# Patient Record
Sex: Male | Born: 1970 | Race: White | Hispanic: No | Marital: Married | Smoking: Never smoker
Health system: Southern US, Community
[De-identification: ages and names within clinical notes are randomized; demographics above are authoritative.]

---

## 2001-12-07 ENCOUNTER — Encounter: Payer: Self-pay | Admitting: Family Medicine

## 2006-12-16 ENCOUNTER — Emergency Department (HOSPITAL_COMMUNITY): Admission: EM | Admit: 2006-12-16 | Discharge: 2006-12-16 | Payer: Self-pay | Admitting: Emergency Medicine

## 2008-08-13 ENCOUNTER — Emergency Department (HOSPITAL_COMMUNITY): Admission: EM | Admit: 2008-08-13 | Discharge: 2008-08-13 | Payer: Self-pay | Admitting: Emergency Medicine

## 2009-04-04 ENCOUNTER — Ambulatory Visit: Payer: Self-pay | Admitting: Family Medicine

## 2009-04-04 DIAGNOSIS — D1800 Hemangioma unspecified site: Secondary | ICD-10-CM | POA: Insufficient documentation

## 2009-04-04 DIAGNOSIS — R109 Unspecified abdominal pain: Secondary | ICD-10-CM | POA: Insufficient documentation

## 2009-04-05 ENCOUNTER — Ambulatory Visit: Payer: Self-pay | Admitting: Family Medicine

## 2009-04-05 LAB — CONVERTED CEMR LAB
ALT: 29 units/L (ref 0–53)
AST: 23 units/L (ref 0–37)
Albumin: 4.4 g/dL (ref 3.5–5.2)
Alkaline Phosphatase: 70 units/L (ref 39–117)
BUN: 12 mg/dL (ref 6–23)
Basophils Absolute: 0 10*3/uL (ref 0.0–0.1)
Blood in Urine, dipstick: NEGATIVE
CO2: 30 meq/L (ref 19–32)
Chloride: 102 meq/L (ref 96–112)
Direct LDL: 158.6 mg/dL
Eosinophils Relative: 1.7 % (ref 0.0–5.0)
Glucose, Bld: 96 mg/dL (ref 70–99)
Glucose, Urine, Semiquant: NEGATIVE
HDL: 48.4 mg/dL (ref >39.00)
Monocytes Absolute: 0.3 10*3/uL (ref 0.1–1.0)
Monocytes Relative: 5.1 % (ref 3.0–12.0)
Neutrophils Relative %: 55.5 % (ref 43.0–77.0)
Platelets: 140 10*3/uL — ABNORMAL LOW (ref 150.0–400.0)
Potassium: 4.4 meq/L (ref 3.5–5.1)
RDW: 12.1 % (ref 11.5–14.6)
Specific Gravity, Urine: 1.02
TSH: 2.14 microintl units/mL (ref 0.35–5.50)
Total Protein: 7.4 g/dL (ref 6.0–8.3)
WBC Urine, dipstick: NEGATIVE
WBC: 6.5 10*3/uL (ref 4.5–10.5)
pH: 8

## 2009-04-17 ENCOUNTER — Ambulatory Visit: Payer: Self-pay | Admitting: Family Medicine

## 2009-06-27 ENCOUNTER — Ambulatory Visit: Payer: Self-pay | Admitting: Family Medicine

## 2009-06-27 DIAGNOSIS — M79609 Pain in unspecified limb: Secondary | ICD-10-CM

## 2010-03-20 NOTE — Assessment & Plan Note (Signed)
Summary: BRAND NEW PT/TO EST/CJR   Vital Signs:  Patient profile:   40 year old male Height:      70.25 inches Weight:      196 pounds BMI:     28.02 Temp:     98.4 degrees F oral Pulse rate:   80 / minute Pulse rhythm:   regular Resp:     12 per minute BP sitting:   120 / 88  (left arm) Cuff size:   regular  Vitals Entered By: Sid Falcon LPN (April 04, 2009 11:02 AM)  Nutrition Counseling: Patient's BMI is greater than 25 and therefore counseled on weight management options. CC: New to establish, GI concerns   History of Present Illness: new patient to establish care. No chronic medical problems. Takes no medications. No known drug allergies. No prior surgeries. Has not had a complete physical couple years on like to schedule.  Family history significant for mother having breast cancer father died of esophageal cancer  Heart disease in grandparent.  Patient is married has 2 yo daughter. Never smoked. Occasional alcohol use. 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  Patient has GI concerns. Most of his life his had some infrequent bowel movements which have improved with fiber intake and exercise. He has what he describes as probable gaseous discomfort very sporadically and intermittently with diffuse pains but no clear exacerbating features or alleviating features. No recent change in stool habits.  Mild to moderate intensity of pain.  Not related to any specific foods.  Appetite good and weight relatively stable.  Scatterd asymtomatic reddish skin lesions on trunk noted past year.  Preventive Screening-Counseling & Management  Alcohol-Tobacco     Smoking Status: never  Caffeine-Diet-Exercise     Does Patient Exercise: yes  Allergies (verified): No Known Drug Allergies  Past History:  Family History: Last updated: 04/04/2009 Breast cancer, mother  Social History: Last updated: 04/04/2009 Occupation: Married Never  Smoked Alcohol use-no Regular exercise-yes  Risk Factors: Exercise: yes (04/04/2009)  Risk Factors: Smoking Status: never (04/04/2009) PMH-FH-SH reviewed for relevance  Family History: Breast cancer, mother  Social History: Occupation: Married Never Smoked Alcohol use-no Regular exercise-yes Occupation:  employed Smoking Status:  never Does Patient Exercise:  yes  Review of Systems  The patient denies anorexia, fever, weight loss, chest pain, syncope, melena, hematochezia, incontinence, severe indigestion/heartburn, muscle weakness, and enlarged lymph nodes.         has a couple small erythematous asymptomatic skin lesions on trunk which she wishes to have evaluated  Physical Exam  General:  Well-developed,well-nourished,in no acute distress; alert,appropriate and cooperative throughout examination Head:  Normocephalic and atraumatic without obvious abnormalities. No apparent alopecia or balding. Ears:  External ear exam shows no significant lesions or deformities.  Otoscopic examination reveals clear canals, tympanic membranes are intact bilaterally without bulging, retraction, inflammation or discharge. Hearing is grossly normal bilaterally. Mouth:  Oral mucosa and oropharynx without lesions or exudates.  Teeth in good repair. Neck:  No deformities, masses, or tenderness noted. Lungs:  Normal respiratory effort, chest expands symmetrically. Lungs are clear to auscultation, no crackles or wheezes. Heart:  Normal rate and regular rhythm. S1 and S2 normal without gallop, murmur, click, rub or other extra sounds. Skin:  has a few scattered small angiomas trunk which are benign no concerning lesions noted Cervical Nodes:  No lymphadenopathy noted Psych:  normally interactive, good eye contact, not anxious appearing, and not depressed appearing.     Impression &  Recommendations:  Problem # 1:  ABDOMINAL PAIN, UNSPECIFIED SITE (ICD-789.00) suspect gaseous.  Pt will schedule  CPE and discuss further then.  Discussed appropriate daily intake of fiber and exercise.  Problem # 2:  ANGIOMA (ICD-228.00) reassured these are benign  Complete Medication List: 1)  Omega 3 340 Mg Cpdr (Omega-3 fatty acids) .... Once daily  Patient Instructions: 1)  Schedule complete physical examination at your convenience 2)  It is important that you exercise reguarly at least 20 minutes 5 times a week. If you develop chest pain, have severe difficulty breathing, or feel very tired, stop exercising immediately and seek medical attention.  3)  You need to lose weight. Consider a lower calorie diet and regular exercise.

## 2010-03-20 NOTE — Assessment & Plan Note (Signed)
Summary: finger trauma/dm   Vital Signs:  Patient profile:   40 year old male Temp:     98.7 degrees F oral BP sitting:   110 / 78  (left arm) Cuff size:   large  Vitals Entered By: Sid Falcon LPN (Jun 27, 2009 12:32 PM) CC: left hand discomfort   History of Present Illness: One week history left hand pain. Pain radiates from the MCP joint ring finger left hand up toward the wrist. Occasional sharp shooting pains along the ulnar aspect of the forearm. No specific injury. Has been doing a substantial amount of yard work over the past couple of weeks. No visible swelling. No weakness. Left-hand-dominant. Denies any numbness.  No neck pain.  Has not taken anything for relief. Pain moderate severity.  Allergies (verified): No Known Drug Allergies  Review of Systems      See HPI  Physical Exam  General:  Well-developed,well-nourished,in no acute distress; alert,appropriate and cooperative throughout examination Extremities:  left hand reveals no edema, erythema, or ecchymosis. No localizing bony tenderness in the wrist or hand. Full range of motion wrists. Neurologic:  normal sensory function throughout the hand. Deep tendon reflexes are trace to 1+ upper extremities bilaterally. No strength deficits noted.   Impression & Recommendations:  Problem # 1:  HAND PAIN (ICD-729.5) ?synovitis 4th MCP joint from overuse.  Sharp shooting pains ?ulnar nerve.  Nonfocal neuro exam at this time.  He will try OTC NSAID for hand and touch base 2 weeks if no better.  Complete Medication List: 1)  Omega 3 340 Mg Cpdr (Omega-3 fatty acids) .... Once daily  Patient Instructions: 1)  Take over the counter antiinflammatory medication and followup in 2 weeks if no better.

## 2010-03-20 NOTE — Assessment & Plan Note (Signed)
Summary: cpx/njr   Vital Signs:  Patient profile:   40 year old male Height:      70.25 inches Weight:      200 pounds Temp:     97.8 degrees F oral Pulse rate:   80 / minute Pulse rhythm:   regular Resp:     12 per minute BP sitting:   110 / 70  (left arm) Cuff size:   regular  Vitals Entered By: Sid Falcon LPN (April 17, 2009 2:52 PM) CC: Adult CPX   History of Present Illness: pt here for CPE.  Tetanus 2009.  Recently started back more regular exercise.  Training for a triathlon. PMH, SH, and FH reviewed and as recorded.  Allergies (verified): No Known Drug Allergies  Past History:  Family History: Last updated: 04/04/2009 Breast cancer, mother  Social History: Last updated: 04/04/2009 Occupation: Married Never Smoked Alcohol use-no Regular exercise-yes  Risk Factors: Exercise: yes (04/04/2009)  Risk Factors: Smoking Status: never (04/04/2009)  Past Medical History: no chronic problems.  Past Surgical History: none PMH-FH-SH reviewed for relevance  Review of Systems  The patient denies anorexia, fever, weight loss, weight gain, vision loss, decreased hearing, hoarseness, chest pain, syncope, dyspnea on exertion, peripheral edema, prolonged cough, headaches, hemoptysis, abdominal pain, melena, hematochezia, severe indigestion/heartburn, hematuria, incontinence, genital sores, muscle weakness, suspicious skin lesions, transient blindness, difficulty walking, depression, unusual weight change, abnormal bleeding, and testicular masses.    Physical Exam  General:  Well-developed,well-nourished,in no acute distress; alert,appropriate and cooperative throughout examination Head:  Normocephalic and atraumatic without obvious abnormalities. No apparent alopecia or balding. Eyes:  No corneal or conjunctival inflammation noted. EOMI. Perrla. Funduscopic exam benign, without hemorrhages, exudates or papilledema. Vision grossly normal. Ears:  External ear  exam shows no significant lesions or deformities.  Otoscopic examination reveals clear canals, tympanic membranes are intact bilaterally without bulging, retraction, inflammation or discharge. Hearing is grossly normal bilaterally. Nose:  External nasal examination shows no deformity or inflammation. Nasal mucosa are pink and moist without lesions or exudates. Mouth:  Oral mucosa and oropharynx without lesions or exudates.  Teeth in good repair. Neck:  No deformities, masses, or tenderness noted. Lungs:  Normal respiratory effort, chest expands symmetrically. Lungs are clear to auscultation, no crackles or wheezes. Heart:  Normal rate and regular rhythm. S1 and S2 normal without gallop, murmur, click, rub or other extra sounds. Abdomen:  Bowel sounds positive,abdomen soft and non-tender without masses, organomegaly or hernias noted. Genitalia:  Testes bilaterally descended without nodularity, tenderness or masses. No scrotal masses or lesions. No penis lesions or urethral discharge. Msk:  No deformity or scoliosis noted of thoracic or lumbar spine.   Extremities:  No clubbing, cyanosis, edema, or deformity noted with normal full range of motion of all joints.   Neurologic:  No cranial nerve deficits noted. Station and gait are normal. Plantar reflexes are down-going bilaterally. DTRs are symmetrical throughout. Sensory, motor and coordinative functions appear intact. Skin:  Intact without suspicious lesions or rashes Cervical Nodes:  No lymphadenopathy noted Psych:  Cognition and judgment appear intact. Alert and cooperative with normal attention span and concentration. No apparent delusions, illusions, hallucinations   Impression & Recommendations:  Problem # 1:  Preventive Health Care (ICD-V70.0) Work on weight loss.  Pt will schedule f/u lipids in 4-6 months.  Minimally low plts of 140K (doubt significant) and will repeat CBC at that time.  Complete Medication List: 1)  Omega 3 340 Mg Cpdr  (Omega-3 fatty acids) .... Once  daily  Patient Instructions: 1)  Schedule the following labs in 4 months:   2)  Lipid panel  272.4 3)  CBC       287.5 4)  It is important that you exercise reguarly at least 20 minutes 5 times a week. If you develop chest pain, have severe difficulty breathing, or feel very tired, stop exercising immediately and seek medical attention.  5)  You need to lose weight. Consider a lower calorie diet and regular exercise.   Preventive Care Screening  Last Tetanus Booster:    Date:  02/19/2007    Results:  Historical      [place] series 2010

## 2010-11-28 LAB — POCT RAPID STREP A: Streptococcus, Group A Screen (Direct): NEGATIVE

## 2013-05-09 ENCOUNTER — Encounter (HOSPITAL_COMMUNITY): Payer: Self-pay | Admitting: Emergency Medicine

## 2013-05-09 ENCOUNTER — Inpatient Hospital Stay (HOSPITAL_COMMUNITY)
Admission: EM | Admit: 2013-05-09 | Discharge: 2013-05-12 | DRG: 087 | Disposition: A | Discharge status: Home / Self Care | Payer: BC Managed Care – PPO | Attending: Neurosurgery | Admitting: Neurosurgery

## 2013-05-09 ENCOUNTER — Emergency Department (HOSPITAL_COMMUNITY): Payer: BC Managed Care – PPO

## 2013-05-09 DIAGNOSIS — S06309A Unspecified focal traumatic brain injury with loss of consciousness of unspecified duration, initial encounter: Secondary | ICD-10-CM

## 2013-05-09 DIAGNOSIS — S02109A Fracture of base of skull, unspecified side, initial encounter for closed fracture: Principal | ICD-10-CM | POA: Diagnosis present

## 2013-05-09 DIAGNOSIS — D696 Thrombocytopenia, unspecified: Secondary | ICD-10-CM | POA: Diagnosis not present

## 2013-05-09 DIAGNOSIS — S066X9A Traumatic subarachnoid hemorrhage with loss of consciousness of unspecified duration, initial encounter: Principal | ICD-10-CM

## 2013-05-09 DIAGNOSIS — S069X1A Unspecified intracranial injury with loss of consciousness of 30 minutes or less, initial encounter: Secondary | ICD-10-CM | POA: Diagnosis present

## 2013-05-09 DIAGNOSIS — S066X1A Traumatic subarachnoid hemorrhage with loss of consciousness of 30 minutes or less, initial encounter: Secondary | ICD-10-CM

## 2013-05-09 DIAGNOSIS — W010XXA Fall on same level from slipping, tripping and stumbling without subsequent striking against object, initial encounter: Secondary | ICD-10-CM | POA: Diagnosis present

## 2013-05-09 DIAGNOSIS — S064X9A Epidural hemorrhage with loss of consciousness of unspecified duration, initial encounter: Principal | ICD-10-CM

## 2013-05-09 DIAGNOSIS — S0630AA Unspecified focal traumatic brain injury with loss of consciousness status unknown, initial encounter: Secondary | ICD-10-CM

## 2013-05-09 DIAGNOSIS — S069X9A Unspecified intracranial injury with loss of consciousness of unspecified duration, initial encounter: Secondary | ICD-10-CM | POA: Diagnosis present

## 2013-05-09 DIAGNOSIS — S065X9A Traumatic subdural hemorrhage with loss of consciousness of unspecified duration, initial encounter: Principal | ICD-10-CM

## 2013-05-09 MED ORDER — MORPHINE SULFATE 4 MG/ML IJ SOLN
4.0000 mg | Freq: Once | INTRAMUSCULAR | Status: AC
  Administered 2013-05-09: 4 mg via INTRAVENOUS
  Filled 2013-05-09: qty 1

## 2013-05-09 MED ORDER — SODIUM CHLORIDE 0.9 % IV SOLN
INTRAVENOUS | Status: DC
  Administered 2013-05-10 – 2013-05-11 (×4): via INTRAVENOUS
  Filled 2013-05-09: qty 1000

## 2013-05-09 MED ORDER — MORPHINE SULFATE 2 MG/ML IJ SOLN
1.0000 mg | INTRAMUSCULAR | Status: DC | PRN
  Administered 2013-05-10 (×5): 2 mg via INTRAVENOUS
  Filled 2013-05-09 (×5): qty 1

## 2013-05-09 MED ORDER — ACETAMINOPHEN 325 MG PO TABS
650.0000 mg | ORAL_TABLET | Freq: Once | ORAL | Status: AC
  Administered 2013-05-09: 650 mg via ORAL
  Filled 2013-05-09: qty 2

## 2013-05-09 MED ORDER — HYDROMORPHONE HCL PF 1 MG/ML IJ SOLN
1.0000 mg | Freq: Once | INTRAMUSCULAR | Status: AC
  Administered 2013-05-09: 1 mg via INTRAVENOUS
  Filled 2013-05-09: qty 1

## 2013-05-09 MED ORDER — ONDANSETRON HCL 4 MG/2ML IJ SOLN
4.0000 mg | Freq: Once | INTRAMUSCULAR | Status: AC
  Administered 2013-05-09: 4 mg via INTRAVENOUS
  Filled 2013-05-09: qty 2

## 2013-05-09 MED ORDER — TETANUS-DIPHTH-ACELL PERTUSSIS 5-2.5-18.5 LF-MCG/0.5 IM SUSP
0.5000 mL | Freq: Once | INTRAMUSCULAR | Status: AC
  Administered 2013-05-09: 0.5 mL via INTRAMUSCULAR
  Filled 2013-05-09: qty 0.5

## 2013-05-09 MED ORDER — ONDANSETRON HCL 4 MG/2ML IJ SOLN: 4.0000 mg | Freq: Four times a day (QID) | INTRAMUSCULAR | Status: DC | PRN

## 2013-05-09 MED ORDER — MORPHINE SULFATE 2 MG/ML IJ SOLN
2.0000 mg | Freq: Once | INTRAMUSCULAR | Status: AC
  Administered 2013-05-09: 2 mg via INTRAVENOUS
  Filled 2013-05-09 (×2): qty 1

## 2013-05-09 NOTE — ED Provider Notes (Signed)
CSN: [id number]     Arrival date & time 05/09/13  1929 History   First MD Initiated Contact with Patient 05/09/13 1944     Chief Complaint  Patient presents with  . Fall     HPI 43 yo M presents with head injury.  One to two hours ago, was playing with his daughter.  Was running backward, tripped, and fell from ground level, striking his head on the sidewalk.  He lost consciousness for 1-2 minutes.  When he awoke, he was amnestic and asking repetitive questions.  He has also had a severe headache and nausea.  Symptoms are not improving.  No treatments tried.  Symptoms are aggravated by bright light and movement, relieved by nothing.  Headache is global, throbbing.    He is previously healthy, does not take any antiplatelets or anticoagulants.  Denies neck pain, loss of vision, difficulty with speech, weakness, numnbess, or paresthesias.   History reviewed. No pertinent past medical history. History reviewed. No pertinent past surgical history. History reviewed. No pertinent family history. History  Substance Use Topics  . Smoking status: Never Smoker   . Smokeless tobacco: Not on file  . Alcohol Use: No    Review of Systems  Constitutional: Positive for fatigue. Negative for fever and chills.  HENT: Negative for congestion and rhinorrhea.   Eyes: Positive for photophobia. Negative for visual disturbance.  Respiratory: Negative for cough and shortness of breath.   Cardiovascular: Negative for chest pain and leg swelling.  Gastrointestinal: Positive for nausea. Negative for vomiting, abdominal pain and diarrhea.  Genitourinary: Negative for dysuria, urgency, frequency, flank pain and difficulty urinating.  Musculoskeletal: Negative for back pain, neck pain and neck stiffness.  Skin: Negative for rash.  Neurological: Positive for dizziness and headaches. Negative for syncope, weakness and numbness.  All other systems reviewed and are negative.      Allergies  Review of  patient's allergies indicates no known allergies.  Home Medications  No current outpatient prescriptions on file. BP 120/59  Pulse 67  Temp(Src) 99.3 F (37.4 C) (Oral)  Resp 15  Ht 5' 10.25" (1.784 m)  Wt 216 lb 7.9 oz (98.2 kg)  BMI 30.85 kg/m2  SpO2 93% Physical Exam  Nursing note and vitals reviewed. Constitutional: He appears well-developed and well-nourished.  HENT:  Head: Normocephalic.  Right Ear: External ear normal.  There is blood in the left ear.  No external laceration or bleeding source identified.  Blood in ear canal.    Eyes: Conjunctivae and EOM are normal. Pupils are equal, round, and reactive to light.  Neck: Neck supple.  In C collar.  No spine tenderness or deformity.  Cardiovascular: Normal rate, regular rhythm, normal heart sounds and intact distal pulses.  Exam reveals no gallop and no friction rub.   No murmur heard. Pulmonary/Chest: Effort normal and breath sounds normal. No respiratory distress. He has no wheezes. He has no rales. He exhibits no tenderness.  Abdominal: Soft. He exhibits no distension. There is no tenderness. There is no rebound and no guarding.  Musculoskeletal: Normal range of motion. He exhibits no tenderness.  Neurological: He is alert. He has normal strength. He is disoriented (oriented to person, and time, but asking repetitive questions, amnestic to the event). No cranial nerve deficit or sensory deficit. He displays no seizure activity. GCS eye subscore is 4. GCS verbal subscore is 4. GCS motor subscore is 6.  Skin: Skin is warm and dry.    ED Course  Procedures (including  critical care time) Labs Review Labs Reviewed  CBC WITH DIFFERENTIAL - Abnormal; Notable for the following:    WBC 12.1 (*)    Platelets 123 (*)    Neutrophils Relative % 87 (*)    Neutro Abs 10.6 (*)    Lymphocytes Relative 9 (*)    All other components within normal limits  COMPREHENSIVE METABOLIC PANEL - Abnormal; Notable for the following:    Sodium  136 (*)    Glucose, Bld 111 (*)    GFR calc non Af Amer 89 (*)    All other components within normal limits  MRSA PCR SCREENING  TYPE AND SCREEN  ABO/RH   Imaging Review Ct Head Wo Contrast  05/09/2013   CLINICAL DATA:  43 year old male with head and neck injury following fall. Loss of consciousness  EXAM: CT HEAD WITHOUT CONTRAST  CT CERVICAL SPINE WITHOUT CONTRAST  TECHNIQUE: Multidetector CT imaging of the head and cervical spine was performed following the standard protocol without intravenous contrast. Multiplanar CT image reconstructions of the cervical spine were also generated.  COMPARISON:  None.  FINDINGS: CT HEAD FINDINGS  A 1 x 1.5 cm hemorrhagic contusion within the posterior left temporal lobe identified. There may be a small amount of subarachnoid hemorrhage within the inferior sylvian fissures bilaterally.  There is no evidence of midline shift, hydrocephalus or acute infarction.  A longitudinal left temporal bone fracture is better evaluated on the cervical spine study.  CT CERVICAL SPINE FINDINGS  Cervical alignment is noted.  There is no evidence of cervical spine fracture, subluxation or prevertebral soft tissue swelling.  There is a longitudinal left temporal bone fracture with fluid in scattered mastoid air cells and within the left middle ear. The middle ear ossicles appear grossly unremarkable.  There is no evidence of fluid within the sphenoid sinuses.  IMPRESSION: Left temporal lobe hemorrhagic contusion, possible subarachnoid hemorrhage within the sylvian fissures bilaterally, and longitudinal left temporal bone fracture.  No static evidence of acute injury to the cervical spine.  Critical Value/emergent results were called by telephone at the time of interpretation on 05/09/2013 at 9:45 PM to Dr. Wendall Papa , who verbally acknowledged these results.   Electronically Signed   By: Hassan Rowan M.D.   On: 05/09/2013 21:47   Ct Cervical Spine Wo Contrast  05/09/2013   CLINICAL  DATA:  43 year old male with head and neck injury following fall. Loss of consciousness  EXAM: CT HEAD WITHOUT CONTRAST  CT CERVICAL SPINE WITHOUT CONTRAST  TECHNIQUE: Multidetector CT imaging of the head and cervical spine was performed following the standard protocol without intravenous contrast. Multiplanar CT image reconstructions of the cervical spine were also generated.  COMPARISON:  None.  FINDINGS: CT HEAD FINDINGS  A 1 x 1.5 cm hemorrhagic contusion within the posterior left temporal lobe identified. There may be a small amount of subarachnoid hemorrhage within the inferior sylvian fissures bilaterally.  There is no evidence of midline shift, hydrocephalus or acute infarction.  A longitudinal left temporal bone fracture is better evaluated on the cervical spine study.  CT CERVICAL SPINE FINDINGS  Cervical alignment is noted.  There is no evidence of cervical spine fracture, subluxation or prevertebral soft tissue swelling.  There is a longitudinal left temporal bone fracture with fluid in scattered mastoid air cells and within the left middle ear. The middle ear ossicles appear grossly unremarkable.  There is no evidence of fluid within the sphenoid sinuses.  IMPRESSION: Left temporal lobe hemorrhagic contusion, possible subarachnoid  hemorrhage within the sylvian fissures bilaterally, and longitudinal left temporal bone fracture.  No static evidence of acute injury to the cervical spine.  Critical Value/emergent results were called by telephone at the time of interpretation on 05/09/2013 at 9:45 PM to Dr. Wendall Papa , who verbally acknowledged these results.   Electronically Signed   By: Hassan Rowan M.D.   On: 05/09/2013 21:47     EKG Interpretation None      MDM   43 yo M presents with traumatic SAH and intraparenchymal hemorrhage with LOC after ground level fall, striking his head on concrete.   He has normal vitals.  GCS 14 for confusion.  Asking repetitive questions.  Amnestic to the  event. Has blood on right ear, appears to be coming from ear canal. Non-focal neuro exam with normal strength and sensation in all exetremities; CN 2-12 intact. CT head shows hemorrhagic contusion of temporal lobe, traumatic SAH, and temporal bone fracture. CT C spine with no injury.  Gave tetanus. Pain controlled with morphine, Dilaudid.  zofran for nausea.   Consulted neurosurgery, and patient admitted to neurosurgery service for observation.  Final diagnoses:  Head injury, closed, with brief LOC  Traumatic subarachnoid bleed with LOC of 30 minutes or less  Traumatic intraparenchymal hemorrhage       Wendall Papa, MD 05/10/13 1312

## 2013-05-09 NOTE — ED Notes (Signed)
Pippin MD at bedside.

## 2013-05-09 NOTE — ED Notes (Addendum)
Pt emesis x1

## 2013-05-09 NOTE — H&P (Signed)
George Hahn is an 43 y.o. left handed white male.  HPI: Patient was with his family this evening at the grocery store, and while playing with his daughter, fell backwards, striking the back of his head. His wife reports a 2-3 minutes loss of consciousness. He was brought to the [facility] emergency room, evaluated by the emergency room staff. CT scan of the head revealed a left temporal bone fracture, a left temporal lobe hemorrhagic contusion, and a small amount of bilateral sylvian fissure traumatic subarachnoid hemorrhage.  He has amnesia for the event, and has repeatedly asked the circumstances of the events. Currently he he complains of relatively severe diffuse headache, associated with photophobia. When they attempted to sit him up to give him Tylenol, he vomited a large amount. He experiences intense dizziness with minimal movement of the head. He denies any weakness or seizures.  Past Medical History:  He denies any history of hypertension, myocardial infarction, cancer, stroke, peptic ulcer disease, diabetes, or lung disease.  Past Surgical History: His only previous surgery was oral surgery for implants, in the oral surgeons office.  Family History: Parents both died of cancer, both were heavy smokers.  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 He does not smoke, he has alcohol beverage once or twice a week. He has one daughter.  Allergies: No Known Allergies  Medications: I have reviewed the patient's current medications.  ROS:  Notable for those difficulties described in his history of present illness and past medical history, but is otherwise unremarkable.  Physical Examination: Patient is a well-developed well-nourished white male in no acute distress Blood pressure 126/72, pulse 72, temperature 98.1 F (36.7 C), temperature source Oral, resp. rate 12, SpO2 97.00%. Lungs:  Clear to auscultation, he  has symmetrical respiratory excursion. Heart:  Heart has a regular rate and rhythm, normal S1 and S2. There is no murmur. Abdomen:  Soft, nondistended, bowel sounds present. Extremity:  No clubbing, cyanosis, or edema.  Neurological Examination: Mental Status Examination:  Awake, alert, oriented to name, [name], and 05/09/2013. Cranial Nerve Examination:  Pupils are equal round and reactive to light, and about 2 mm.  EOMI. Facial sensation intact. Facial movements symmetrical. Hearing present bilaterally. Palatal movement symmetrical. Shoulder shrug is symmetrical. Tongue is midline. Motor Examination:  5/5 strength in upper and lower extremities. No drift of upper extremities. Sensory Examination:  Intact to pinprick throughout. Reflex Examination:   Symmetrical in the upper and lower extremities. Toes downgoing bilaterally. Gait and Stance Examination:  Not tested due to the nature of his condition.   Ct Head Wo Contrast  05/09/2013   CLINICAL DATA:  43 year old male with head and neck injury following fall. Loss of consciousness  EXAM: CT HEAD WITHOUT CONTRAST  CT CERVICAL SPINE WITHOUT CONTRAST  TECHNIQUE: Multidetector CT imaging of the head and cervical spine was performed following the standard protocol without intravenous contrast. Multiplanar CT image reconstructions of the cervical spine were also generated.  COMPARISON:  None.  FINDINGS: CT HEAD FINDINGS  A 1 x 1.5 cm hemorrhagic contusion within the posterior left temporal lobe identified. There may be a small amount of subarachnoid hemorrhage within the inferior sylvian fissures bilaterally.  There is no evidence of midline shift, hydrocephalus or acute infarction.  A longitudinal left temporal bone fracture is better evaluated on the cervical spine study.  CT CERVICAL SPINE FINDINGS  Cervical alignment is noted.  There is no  evidence of cervical spine fracture, subluxation or prevertebral soft tissue swelling.  There is a  longitudinal left temporal bone fracture with fluid in scattered mastoid air cells and within the left middle ear. The middle ear ossicles appear grossly unremarkable.  There is no evidence of fluid within the sphenoid sinuses.  IMPRESSION: Left temporal lobe hemorrhagic contusion, possible subarachnoid hemorrhage within the sylvian fissures bilaterally, and longitudinal left temporal bone fracture.  No static evidence of acute injury to the cervical spine.  Critical Value/emergent results were called by telephone at the time of interpretation on 05/09/2013 at 9:45 PM to Dr. Wendall Papa , who verbally acknowledged these results.   Electronically Signed   By: Hassan Rowan M.D.   On: 05/09/2013 21:47   Ct Cervical Spine Wo Contrast  05/09/2013   CLINICAL DATA:  43 year old male with head and neck injury following fall. Loss of consciousness  EXAM: CT HEAD WITHOUT CONTRAST  CT CERVICAL SPINE WITHOUT CONTRAST  TECHNIQUE: Multidetector CT imaging of the head and cervical spine was performed following the standard protocol without intravenous contrast. Multiplanar CT image reconstructions of the cervical spine were also generated.  COMPARISON:  None.  FINDINGS: CT HEAD FINDINGS  A 1 x 1.5 cm hemorrhagic contusion within the posterior left temporal lobe identified. There may be a small amount of subarachnoid hemorrhage within the inferior sylvian fissures bilaterally.  There is no evidence of midline shift, hydrocephalus or acute infarction.  A longitudinal left temporal bone fracture is better evaluated on the cervical spine study.  CT CERVICAL SPINE FINDINGS  Cervical alignment is noted.  There is no evidence of cervical spine fracture, subluxation or prevertebral soft tissue swelling.  There is a longitudinal left temporal bone fracture with fluid in scattered mastoid air cells and within the left middle ear. The middle ear ossicles appear grossly unremarkable.  There is no evidence of fluid within the sphenoid sinuses.   IMPRESSION: Left temporal lobe hemorrhagic contusion, possible subarachnoid hemorrhage within the sylvian fissures bilaterally, and longitudinal left temporal bone fracture.  No static evidence of acute injury to the cervical spine.  Critical Value/emergent results were called by telephone at the time of interpretation on 05/09/2013 at 9:45 PM to Dr. Wendall Papa , who verbally acknowledged these results.   Electronically Signed   By: Hassan Rowan M.D.   On: 05/09/2013 21:47     Assessment/Plan: Otherwise healthy male, who suffered a closed head injury this evening, with a brief loss of consciousness, less than 1 hour. CT scan reveals the left temporal skull fracture, left temporal lobe hemorrhagic cerebral contusion, and bilateral sylvian fissure traumatic subarachnoid hemorrhage. Glasgow Coma Scale 15/15. Symptomatically he also has evidence of a vestibular concussion.  Patient will be admitted to the neurosurgical intensive care unit for further care. He'll be kept n.p.o., and supported with IV fluids. We will plan on rechecking his CT scan of the brain in about 30 hours.  I spoke with the patient and his wife, who was at his bedside, and discussed my assessment and recommendations for treatment and care. Their questions were answered for them.  Hosie Spangle, MD 05/09/2013, 11:13 PM

## 2013-05-09 NOTE — ED Notes (Signed)
Patient was at [employer] with family and racing his daughter backwards, tripped and fell, hitting head.  Patient did LOC for about 20 seconds.  Patient does have laceration on left ear, but does have repetitive questioning with EMS and staff here.  Patient does not have any memory of why he was at [employer] or remember the incident. Patient is conscious and alert, but continues with repetitive questioning.

## 2013-05-09 NOTE — ED Notes (Signed)
C spine cleared by Pippin MD

## 2013-05-10 LAB — CBC WITH DIFFERENTIAL/PLATELET
Basophils Absolute: 0 10*3/uL (ref 0.0–0.1)
Basophils Relative: 0 % (ref 0–1)
Eosinophils Absolute: 0 10*3/uL (ref 0.0–0.7)
Eosinophils Relative: 0 % (ref 0–5)
HCT: 43.5 % (ref 39.0–52.0)
Hemoglobin: 15.4 g/dL (ref 13.0–17.0)
Lymphocytes Relative: 9 % — ABNORMAL LOW (ref 12–46)
Lymphs Abs: 1.1 10*3/uL (ref 0.7–4.0)
MCH: 31.6 pg (ref 26.0–34.0)
MCHC: 35.4 g/dL (ref 30.0–36.0)
MCV: 89.1 fL (ref 78.0–100.0)
Monocytes Absolute: 0.5 10*3/uL (ref 0.1–1.0)
Monocytes Relative: 4 % (ref 3–12)
Neutro Abs: 10.6 10*3/uL — ABNORMAL HIGH (ref 1.7–7.7)
Neutrophils Relative %: 87 % — ABNORMAL HIGH (ref 43–77)
Platelets: 123 10*3/uL — ABNORMAL LOW (ref 150–400)
RBC: 4.88 MIL/uL (ref 4.22–5.81)
RDW: 12.4 % (ref 11.5–15.5)
WBC: 12.1 10*3/uL — ABNORMAL HIGH (ref 4.0–10.5)

## 2013-05-10 LAB — ABO/RH: ABO/RH(D): A NEG

## 2013-05-10 LAB — COMPREHENSIVE METABOLIC PANEL
ALT: 31 U/L (ref 0–53)
AST: 29 U/L (ref 0–37)
Albumin: 4 g/dL (ref 3.5–5.2)
Alkaline Phosphatase: 66 U/L (ref 39–117)
BUN: 13 mg/dL (ref 6–23)
CO2: 20 mEq/L (ref 19–32)
Calcium: 8.7 mg/dL (ref 8.4–10.5)
Chloride: 100 mEq/L (ref 96–112)
Creatinine, Ser: 1.01 mg/dL (ref 0.50–1.35)
GFR calc Af Amer: >90 mL/min (ref >90)
GFR calc non Af Amer: 89 mL/min — ABNORMAL LOW (ref >90)
Glucose, Bld: 111 mg/dL — ABNORMAL HIGH (ref 70–99)
Potassium: 4.3 mEq/L (ref 3.7–5.3)
Sodium: 136 mEq/L — ABNORMAL LOW (ref 137–147)
Total Bilirubin: 0.4 mg/dL (ref 0.3–1.2)
Total Protein: 7.2 g/dL (ref 6.0–8.3)

## 2013-05-10 LAB — MRSA PCR SCREENING: MRSA by PCR: NEGATIVE

## 2013-05-10 LAB — TYPE AND SCREEN
ABO/RH(D): A NEG
Antibody Screen: NEGATIVE

## 2013-05-10 MED ORDER — OXYCODONE-ACETAMINOPHEN 5-325 MG PO TABS
1.0000 | ORAL_TABLET | ORAL | Status: DC | PRN
  Administered 2013-05-10 – 2013-05-11 (×4): 2 via ORAL
  Administered 2013-05-11: 1 via ORAL
  Administered 2013-05-12 (×2): 2 via ORAL
  Filled 2013-05-10 (×2): qty 2
  Filled 2013-05-10: qty 1
  Filled 2013-05-10 (×4): qty 2

## 2013-05-10 MED ORDER — HYDROCODONE-ACETAMINOPHEN 5-325 MG PO TABS
1.0000 | ORAL_TABLET | ORAL | Status: DC | PRN
  Administered 2013-05-10 – 2013-05-11 (×2): 2 via ORAL
  Filled 2013-05-10 (×2): qty 2

## 2013-05-10 NOTE — Progress Notes (Signed)
UR completed. Patient changed to inpatient- requiring IVF @ 100cc/hr acute [facility]

## 2013-05-10 NOTE — Consult Note (Signed)
Reason for Consult: Left temporal bone fracture Referring Physician: Jovita Gamma, MD  HPI:  George Hahn is an 43 y.o. male admitted yesterday for closed head trauma. He was with his family yesterday evening at a [employer] store, and while playing with his daughter, fell backwards, striking the back of his head. His wife reports a 2-3 minutes loss of consciousness. He was brought to the [facility] emergency room, evaluated by the emergency room staff. CT scan of the head revealed a left temporal bone fracture, a left temporal lobe hemorrhagic contusion, and a small amount of bilateral sylvian fissure traumatic subarachnoid hemorrhage. Currently he complains of muffled hearing on the left.  He had an episode of spinning vertigo this AM.  No previous history of otologic disorder.  No ENT surgery.  History reviewed. No pertinent past medical history. He denies any history of hypertension, myocardial infarction, cancer, stroke, peptic ulcer disease, diabetes, or lung disease.  History reviewed. No pertinent past surgical history.  History reviewed. No pertinent family history.  Social History:  reports that he has never smoked. He does not have any smokeless tobacco history on file. He reports that he does not drink alcohol or use illicit drugs.  Allergies: No Known Allergies  Medications: I have reviewed the patient's current medications. Scheduled:  JGO:[id number] injection, ondansetron (ZOFRAN) IV  Results for orders placed during the hospital encounter of 05/09/13 (from the past 48 hour(s))  CBC WITH DIFFERENTIAL     Status: Abnormal   Collection Time    05/09/13 11:38 PM      Result Value Ref Range   WBC 12.1 (*) 4.0 - 10.5 K/uL   RBC 4.88  4.22 - 5.81 MIL/uL   Hemoglobin 15.4  13.0 - 17.0 g/dL   HCT 43.5  39.0 - 52.0 %   MCV 89.1  78.0 - 100.0 fL   MCH 31.6  26.0 - 34.0 pg   MCHC 35.4  30.0 - 36.0 g/dL   RDW 12.4  11.5 - 15.5 %   Platelets 123 (*) 150 - 400 K/uL   Neutrophils Relative % 87 (*) 43 - 77 %   Neutro Abs 10.6 (*) 1.7 - 7.7 K/uL   Lymphocytes Relative 9 (*) 12 - 46 %   Lymphs Abs 1.1  0.7 - 4.0 K/uL   Monocytes Relative 4  3 - 12 %   Monocytes Absolute 0.5  0.1 - 1.0 K/uL   Eosinophils Relative 0  0 - 5 %   Eosinophils Absolute 0.0  0.0 - 0.7 K/uL   Basophils Relative 0  0 - 1 %   Basophils Absolute 0.0  0.0 - 0.1 K/uL  COMPREHENSIVE METABOLIC PANEL     Status: Abnormal   Collection Time    05/09/13 11:38 PM      Result Value Ref Range   Sodium 136 (*) 137 - 147 mEq/L   Potassium 4.3  3.7 - 5.3 mEq/L   Chloride 100  96 - 112 mEq/L   CO2 20  19 - 32 mEq/L   Glucose, Bld 111 (*) 70 - 99 mg/dL   BUN 13  6 - 23 mg/dL   Creatinine, Ser 1.01  0.50 - 1.35 mg/dL   Calcium 8.7  8.4 - 10.5 mg/dL   Total Protein 7.2  6.0 - 8.3 g/dL   Albumin 4.0  3.5 - 5.2 g/dL   AST 29  0 - 37 U/L   ALT 31  0 - 53 U/L   Alkaline Phosphatase  66  39 - 117 U/L   Total Bilirubin 0.4  0.3 - 1.2 mg/dL   GFR calc non Af Amer 89 (*) >90 mL/min   GFR calc Af Amer >90  >90 mL/min   Comment: (NOTE)     The eGFR has been calculated using the CKD EPI equation.     This calculation has not been validated in all clinical situations.     eGFR's persistently <90 mL/min signify possible Chronic Kidney     Disease.  TYPE AND SCREEN     Status: None   Collection Time    05/09/13 11:38 PM      Result Value Ref Range   ABO/RH(D) A NEG     Antibody Screen NEG     Sample Expiration 05/12/2013    ABO/RH     Status: None   Collection Time    05/09/13 11:59 PM      Result Value Ref Range   ABO/RH(D) A NEG    MRSA PCR SCREENING     Status: None   Collection Time    05/10/13  1:01 AM      Result Value Ref Range   MRSA by PCR NEGATIVE  NEGATIVE   Comment:            The GeneXpert MRSA Assay (FDA     approved for NASAL specimens     only), is one component of a     comprehensive MRSA colonization     surveillance program. It is not     intended to diagnose MRSA      infection nor to guide or     monitor treatment for     MRSA infections.    Ct Head Wo Contrast  05/09/2013   CLINICAL DATA:  43 year old male with head and neck injury following fall. Loss of consciousness  EXAM: CT HEAD WITHOUT CONTRAST  CT CERVICAL SPINE WITHOUT CONTRAST  TECHNIQUE: Multidetector CT imaging of the head and cervical spine was performed following the standard protocol without intravenous contrast. Multiplanar CT image reconstructions of the cervical spine were also generated.  COMPARISON:  None.  FINDINGS: CT HEAD FINDINGS  A 1 x 1.5 cm hemorrhagic contusion within the posterior left temporal lobe identified. There may be a small amount of subarachnoid hemorrhage within the inferior sylvian fissures bilaterally.  There is no evidence of midline shift, hydrocephalus or acute infarction.  A longitudinal left temporal bone fracture is better evaluated on the cervical spine study.  CT CERVICAL SPINE FINDINGS  Cervical alignment is noted.  There is no evidence of cervical spine fracture, subluxation or prevertebral soft tissue swelling.  There is a longitudinal left temporal bone fracture with fluid in scattered mastoid air cells and within the left middle ear. The middle ear ossicles appear grossly unremarkable.  There is no evidence of fluid within the sphenoid sinuses.  IMPRESSION: Left temporal lobe hemorrhagic contusion, possible subarachnoid hemorrhage within the sylvian fissures bilaterally, and longitudinal left temporal bone fracture.  No static evidence of acute injury to the cervical spine.  Critical Value/emergent results were called by telephone at the time of interpretation on 05/09/2013 at 9:45 PM to Dr. Wendall Papa , who verbally acknowledged these results.   Electronically Signed   By: Hassan Rowan M.D.   On: 05/09/2013 21:47   Ct Cervical Spine Wo Contrast  05/09/2013   CLINICAL DATA:  43 year old male with head and neck injury following fall. Loss of consciousness  EXAM: CT  HEAD WITHOUT  CONTRAST  CT CERVICAL SPINE WITHOUT CONTRAST  TECHNIQUE: Multidetector CT imaging of the head and cervical spine was performed following the standard protocol without intravenous contrast. Multiplanar CT image reconstructions of the cervical spine were also generated.  COMPARISON:  None.  FINDINGS: CT HEAD FINDINGS  A 1 x 1.5 cm hemorrhagic contusion within the posterior left temporal lobe identified. There may be a small amount of subarachnoid hemorrhage within the inferior sylvian fissures bilaterally.  There is no evidence of midline shift, hydrocephalus or acute infarction.  A longitudinal left temporal bone fracture is better evaluated on the cervical spine study.  CT CERVICAL SPINE FINDINGS  Cervical alignment is noted.  There is no evidence of cervical spine fracture, subluxation or prevertebral soft tissue swelling.  There is a longitudinal left temporal bone fracture with fluid in scattered mastoid air cells and within the left middle ear. The middle ear ossicles appear grossly unremarkable.  There is no evidence of fluid within the sphenoid sinuses.  IMPRESSION: Left temporal lobe hemorrhagic contusion, possible subarachnoid hemorrhage within the sylvian fissures bilaterally, and longitudinal left temporal bone fracture.  No static evidence of acute injury to the cervical spine.  Critical Value/emergent results were called by telephone at the time of interpretation on 05/09/2013 at 9:45 PM to Dr. Wendall Papa , who verbally acknowledged these results.   Electronically Signed   By: Hassan Rowan M.D.   On: 05/09/2013 21:47   ROS: Reviewed and unremarkable.  Pt is otherwise healthy except as noted above.  Blood pressure 119/71, pulse 86, temperature 99.3 F (37.4 C), temperature source Oral, resp. rate 19, height 5' 10.25" (1.784 m), weight 216 lb 7.9 oz (98.2 kg), SpO2 97.00%. General appearance: alert, cooperative and appears stated age Head: Normocephalic.  ENT: His pupils are equal,  round, reactive to light. Extraocular motion is intact. Examination of the ears shows normal auricle and external auditory canal on the right.  Dry blood is noted in the left ear canal.  Left hemotympanum is noted.  Nasal examination shows normal mucosa, septum, turbinates. Facial examination shows no asymmetry. Palpation of the face elicit no significant tenderness. Oral cavity examination shows no mucosal lacerations. No significant trismus is noted. Palpation of the neck reveals no lymphadenopathy or mass. The trachea is midline.   Assessment/Plan: Closed head trauma resulted in longitudinal left temporal bone fracture.  On the head and the cervical spine CT, the otic capsule appears to be intact.  Will get a high resolution temporal bone CT scan with his AM CT tomorrow.  Based on the findings so far, no acute intervention is needed.  He will need an audiogram as an outpatient once the hemotympanum resolves.  Will follow.  Nanie Dunkleberger,SUI W 05/10/2013, 9:56 AM

## 2013-05-10 NOTE — Progress Notes (Signed)
Subjective: Patient sitting in bed, with head of bed at about 30. Had a couple of more episodes of vomiting after arrival to ICU. Currently asking for a couple of coffee and to be able to go to his condition in [place] this week. Describes stuffiness in his left ear.  Objective: Vital signs in last 24 hours: Filed Vitals:   05/10/13 0600 05/10/13 0700 05/10/13 0748 05/10/13 0800  BP: 108/50 115/63  119/71  Pulse: 74 83  86  Temp:   99.3 F (37.4 C)   TempSrc:   Oral   Resp: 15 18  19   Height:      Weight:      SpO2: 94% 94%  97%    Intake/Output from previous day: 03/22 0701 - 03/23 0700 In: 518.3 [I.V.:518.3] Out: 1250 [Emesis/NG output:1250] Intake/Output this shift: Total I/O In: 100 [I.V.:100] Out: -   Physical Exam:  Awake and alert, oriented. Following commands. Moving all 4 extremities well.  CBC  Recent Labs  05/09/13 2338  WBC 12.1*  HGB 15.4  HCT 43.5  PLT 123*   BMET  Recent Labs  05/09/13 2338  NA 136*  K 4.3  CL 100  CO2 20  GLUCOSE 111*  BUN 13  CREATININE 1.01  CALCIUM 8.7    Assessment/Plan: Will continue to monitor in ICU. For CT scan of the brain in a.m.  We'll keep n.p.o., but eventually begin clear liquids. We will recheck CBC in a.m. to check platelet count, and also recheck BMET in a.m.. [facility] consult Dr. Benjamine Mola regarding left temporal bone fracture.  Hosie Spangle, MD 05/10/2013, 9:29 AM

## 2013-05-11 ENCOUNTER — Inpatient Hospital Stay (HOSPITAL_COMMUNITY): Payer: BC Managed Care – PPO

## 2013-05-11 LAB — CBC WITH DIFFERENTIAL/PLATELET
Basophils Absolute: 0 10*3/uL (ref 0.0–0.1)
Basophils Relative: 0 % (ref 0–1)
Eosinophils Absolute: 0.2 10*3/uL (ref 0.0–0.7)
Eosinophils Relative: 2 % (ref 0–5)
HCT: 37.4 % — ABNORMAL LOW (ref 39.0–52.0)
Hemoglobin: 13.3 g/dL (ref 13.0–17.0)
Lymphocytes Relative: 36 % (ref 12–46)
Lymphs Abs: 2.4 10*3/uL (ref 0.7–4.0)
MCH: 31.9 pg (ref 26.0–34.0)
MCHC: 35.6 g/dL (ref 30.0–36.0)
MCV: 89.7 fL (ref 78.0–100.0)
Monocytes Absolute: 0.5 10*3/uL (ref 0.1–1.0)
Monocytes Relative: 7 % (ref 3–12)
Neutro Abs: 3.7 10*3/uL (ref 1.7–7.7)
Neutrophils Relative %: 55 % (ref 43–77)
Platelets: 110 10*3/uL — ABNORMAL LOW (ref 150–400)
RBC: 4.17 MIL/uL — ABNORMAL LOW (ref 4.22–5.81)
RDW: 12.3 % (ref 11.5–15.5)
WBC: 6.8 10*3/uL (ref 4.0–10.5)

## 2013-05-11 LAB — BASIC METABOLIC PANEL
BUN: 13 mg/dL (ref 6–23)
CO2: 25 mEq/L (ref 19–32)
Calcium: 8.5 mg/dL (ref 8.4–10.5)
Chloride: 102 mEq/L (ref 96–112)
Creatinine, Ser: 1.02 mg/dL (ref 0.50–1.35)
GFR calc Af Amer: >90 mL/min (ref >90)
GFR calc non Af Amer: 88 mL/min — ABNORMAL LOW (ref >90)
Glucose, Bld: 97 mg/dL (ref 70–99)
Potassium: 4 mEq/L (ref 3.7–5.3)
Sodium: 139 mEq/L (ref 137–147)

## 2013-05-11 MED ORDER — ONDANSETRON HCL 4 MG PO TABS: 4.0000 mg | ORAL_TABLET | Freq: Four times a day (QID) | ORAL | Status: DC | PRN

## 2013-05-11 NOTE — Progress Notes (Signed)
Subjective: Pt resting comfortably in bed.  No new c/o.  Objective: Vital signs in last 24 hours: Temp:  [98 F (36.7 C)-99 F (37.2 C)] 98 F (36.7 C) (03/24 0808) Pulse Rate:  [48-81] 48 (03/24 0900) Resp:  [8-26] 14 (03/24 0900) BP: (106-134)/(54-75) 106/68 mmHg (03/24 0900) SpO2:  [90 %-98 %] 95 % (03/24 0900)  General appearance: alert, cooperative. Head: Normocephalic.  ENT: His pupils are equal, round, reactive to light. Extraocular motion is intact. Examination of the ears shows normal auricle and external auditory canal on the right. Dry blood is noted in the left ear canal. Left hemotympanum is noted. Nasal examination shows normal mucosa, septum, turbinates. Facial examination shows no asymmetry. Palpation of the face elicit no significant tenderness. Oral cavity examination shows no mucosal lacerations. No significant trismus is noted. Palpation of the neck reveals no lymphadenopathy or mass. The trachea is midline.    Recent Labs  05/09/13 2338 05/11/13 0334  WBC 12.1* 6.8  HGB 15.4 13.3  HCT 43.5 37.4*  PLT 123* 110*    Recent Labs  05/09/13 2338 05/11/13 0334  NA 136* 139  K 4.3 4.0  CL 100 102  CO2 20 25  GLUCOSE 111* 97  BUN 13 13  CREATININE 1.01 1.02  CALCIUM 8.7 8.5    Medications: I have reviewed the patient's current medications. Scheduled:  [id number], ondansetron (ZOFRAN) IV, ondansetron, oxyCODONE-acetaminophen  Assessment/Plan: Temporal bone CT today shows no otic capsule fracture.  No acute intervention needed at this time. He will need an audiogram as an outpatient once the hemotympanum resolves. Follow up info given to pt.    LOS: 2 days   Durelle Zepeda,SUI W 05/11/2013, 10:02 AM

## 2013-05-11 NOTE — ED Provider Notes (Signed)
Medical screening examination/treatment/procedure(s) were conducted as a shared visit with resident physician and myself.  I personally evaluated the patient during the encounter.  I interviewed and examined the patient. Lungs are CTAB. Cardiac exam wnl. Abdomen soft.  GCS 14 w/ mild perseveration. CT c/w hemorrhagic contusion, SAH, temporal bone fx. NSU consulted and will admit.   CRITICAL CARE Performed by: Pamella Pert, S Total critical care time: 30 min Critical care time was exclusive of separately billable procedures and treating other patients. Critical care was necessary to treat or prevent imminent or life-threatening deterioration. Critical care was time spent personally by me on the following activities: development of treatment plan with patient and/or surrogate as well as nursing, discussions with consultants, evaluation of patient's response to treatment, examination of patient, obtaining history from patient or surrogate, ordering and performing treatments and interventions, ordering and review of laboratory studies, ordering and review of radiographic studies, pulse oximetry and re-evaluation of patient's condition.   Blanchard Kelch, MD 05/11/13 2028

## 2013-05-11 NOTE — Progress Notes (Signed)
Subjective: Patient complaining of pain and discomfort in the back of the neck, as well as diffuse headache. Denies further nausea or vomiting. CT of temporal bones done earlier this morning, not yet interpreted by radiologist. CT of brain without contrast not done this morning (despite having been ordered at the time of admission). Patient go down for CT of brain now.  Objective: Vital signs in last 24 hours: Filed Vitals:   05/11/13 0400 05/11/13 0500 05/11/13 0600 05/11/13 0700  BP: 111/62 126/68 113/62 111/68  Pulse: 51 64 57 52  Temp:      TempSrc:      Resp: 13 17 13 26   Height:      Weight:      SpO2: 94% 96% 95% 90%    Intake/Output from previous day: 03/23 0701 - 03/24 0700 In: 2400 [I.V.:2400] Out: 1300 [Urine:1300] Intake/Output this shift:    Physical Exam:  Awake and alert, oriented to name, [facility], and March 2015. EOMI. Facial movements symmetrical. Moving all 4 extremities well. No drift of upper extremities.  CBC  Recent Labs  05/09/13 2338 05/11/13 0334  WBC 12.1* 6.8  HGB 15.4 13.3  HCT 43.5 37.4*  PLT 123* 110*   BMET  Recent Labs  05/09/13 2338 05/11/13 0334  NA 136* 139  K 4.3 4.0  CL 100 102  CO2 20 25  GLUCOSE 111* 97  BUN 13 13  CREATININE 1.01 1.02  CALCIUM 8.7 8.5    Studies/Results: Ct Head Wo Contrast  05/09/2013   CLINICAL DATA:  43 year old male with head and neck injury following fall. Loss of consciousness  EXAM: CT HEAD WITHOUT CONTRAST  CT CERVICAL SPINE WITHOUT CONTRAST  TECHNIQUE: Multidetector CT imaging of the head and cervical spine was performed following the standard protocol without intravenous contrast. Multiplanar CT image reconstructions of the cervical spine were also generated.  COMPARISON:  None.  FINDINGS: CT HEAD FINDINGS  A 1 x 1.5 cm hemorrhagic contusion within the posterior left temporal lobe identified. There may be a small amount of subarachnoid hemorrhage within the inferior sylvian fissures  bilaterally.  There is no evidence of midline shift, hydrocephalus or acute infarction.  A longitudinal left temporal bone fracture is better evaluated on the cervical spine study.  CT CERVICAL SPINE FINDINGS  Cervical alignment is noted.  There is no evidence of cervical spine fracture, subluxation or prevertebral soft tissue swelling.  There is a longitudinal left temporal bone fracture with fluid in scattered mastoid air cells and within the left middle ear. The middle ear ossicles appear grossly unremarkable.  There is no evidence of fluid within the sphenoid sinuses.  IMPRESSION: Left temporal lobe hemorrhagic contusion, possible subarachnoid hemorrhage within the sylvian fissures bilaterally, and longitudinal left temporal bone fracture.  No static evidence of acute injury to the cervical spine.  Critical Value/emergent results were called by telephone at the time of interpretation on 05/09/2013 at 9:45 PM to Dr. Wendall Papa , who verbally acknowledged these results.   Electronically Signed   By: Hassan Rowan M.D.   On: 05/09/2013 21:47   Ct Cervical Spine Wo Contrast  05/09/2013   CLINICAL DATA:  43 year old male with head and neck injury following fall. Loss of consciousness  EXAM: CT HEAD WITHOUT CONTRAST  CT CERVICAL SPINE WITHOUT CONTRAST  TECHNIQUE: Multidetector CT imaging of the head and cervical spine was performed following the standard protocol without intravenous contrast. Multiplanar CT image reconstructions of the cervical spine were also generated.  COMPARISON:  None.  FINDINGS: CT HEAD FINDINGS  A 1 x 1.5 cm hemorrhagic contusion within the posterior left temporal lobe identified. There may be a small amount of subarachnoid hemorrhage within the inferior sylvian fissures bilaterally.  There is no evidence of midline shift, hydrocephalus or acute infarction.  A longitudinal left temporal bone fracture is better evaluated on the cervical spine study.  CT CERVICAL SPINE FINDINGS  Cervical  alignment is noted.  There is no evidence of cervical spine fracture, subluxation or prevertebral soft tissue swelling.  There is a longitudinal left temporal bone fracture with fluid in scattered mastoid air cells and within the left middle ear. The middle ear ossicles appear grossly unremarkable.  There is no evidence of fluid within the sphenoid sinuses.  IMPRESSION: Left temporal lobe hemorrhagic contusion, possible subarachnoid hemorrhage within the sylvian fissures bilaterally, and longitudinal left temporal bone fracture.  No static evidence of acute injury to the cervical spine.  Critical Value/emergent results were called by telephone at the time of interpretation on 05/09/2013 at 9:45 PM to Dr. Wendall Papa , who verbally acknowledged these results.   Electronically Signed   By: Hassan Rowan M.D.   On: 05/09/2013 21:47    Assessment/Plan: We'll begin to mobilize to sitting in chair, to progress to ambulation in ICU. Will advance diet to regular diet. Discussed condition and recommendations for continued inpatient as well as outpatient care, with both the patient and his wife.  Hosie Spangle, MD 05/11/2013, 8:06 AM

## 2013-05-12 LAB — CBC WITH DIFFERENTIAL/PLATELET
Basophils Absolute: 0 10*3/uL (ref 0.0–0.1)
Basophils Relative: 0 % (ref 0–1)
Eosinophils Absolute: 0.2 10*3/uL (ref 0.0–0.7)
Eosinophils Relative: 2 % (ref 0–5)
HCT: 38.1 % — ABNORMAL LOW (ref 39.0–52.0)
Hemoglobin: 13.5 g/dL (ref 13.0–17.0)
Lymphocytes Relative: 24 % (ref 12–46)
Lymphs Abs: 1.8 10*3/uL (ref 0.7–4.0)
MCH: 31.3 pg (ref 26.0–34.0)
MCHC: 35.4 g/dL (ref 30.0–36.0)
MCV: 88.2 fL (ref 78.0–100.0)
Monocytes Absolute: 0.5 10*3/uL (ref 0.1–1.0)
Monocytes Relative: 7 % (ref 3–12)
Neutro Abs: 5.1 10*3/uL (ref 1.7–7.7)
Neutrophils Relative %: 67 % (ref 43–77)
Platelets: 118 10*3/uL — ABNORMAL LOW (ref 150–400)
RBC: 4.32 MIL/uL (ref 4.22–5.81)
RDW: 12.1 % (ref 11.5–15.5)
WBC: 7.6 10*3/uL (ref 4.0–10.5)

## 2013-05-12 MED ORDER — OXYCODONE-ACETAMINOPHEN 5-325 MG PO TABS: 1.0000 | ORAL_TABLET | ORAL | Status: AC | PRN

## 2013-05-12 NOTE — Discharge Summary (Signed)
Physician Discharge Summary  Patient ID: George Hahn MRN: [id number] DOB/AGE: 03/09/1970 43 y.o.  Admit date: 05/09/2013 Discharge date: 05/12/2013  Admission Diagnoses:  Closed head injury with loss of consciousness less than 1 hour, traumatic subarachnoid hemorrhage, left temporal lobe hemorrhagic cerebral contusion, left temporal skull fracture  Discharge Diagnoses:  Closed head injury with loss of consciousness less than 1 hour, traumatic subarachnoid hemorrhage (resolved), left temporal lobe hemorrhagic cerebral contusion, left temporal skull fracture, mild thrombocytopenia (stabilized)  Active Problems:   Head injury, closed, with brief LOC  Discharged Condition: Improved  Hospital Course: Patient was admitted to the neurosurgical intensive care unit, and supportive care was given. He was supported with IV fluids, and then begun on clear liquids, advanced to regular diet. He had significant symptoms of vestibular concussion, with nausea and vomiting with movement of the head and neck, that have improved. He was seen in ENT consultation by Dr. Benjamine Mola, who evaluated the patient further with a CT of the temporal bones, and is going to follow him as an outpatient, following discharge. We then able to progressively mobilize the patient, initially to a chair, then and living in the ICU. Today his ambulation is much more steady. Patient is asked to be discharged to home, he and his wife have been given instructions regarding activities following discharge. These include complete avoidance of alcohol, no driving for a week (he can ride in the car locally), no work until at least Monday, April 6 (about a week and a half now), no lifting greater than 5 pounds for the first week, no lifting greater than 10 pounds second week, no out-of-town travel for at least 2 weeks. He is to followup with me in the office in 2 weeks with CT the brain without contrast on the day of the appointment (my staff will make  arrangements for the CT and appointment). He's been given a prescription for Percocet for pain, but I have strongly recommended to taper the use of medication as quickly as feasible. I have told him that he can use some of ibuprofen up to 2 tablets 4 times a day.  Discharge Exam: Blood pressure 135/67, pulse 56, temperature 97.7 F (36.5 C), temperature source Oral, resp. rate 17, height 5' 10.25" (1.784 m), weight 98.2 kg (216 lb 7.9 oz), SpO2 98.00%.  Disposition: Home     Medication List         oxyCODONE-acetaminophen 5-325 MG per tablet  Commonly known as:  PERCOCET/ROXICET  Take 1-2 tablets by mouth every 4 (four) hours as needed for moderate pain.         Signed: Hosie Spangle, MD 05/12/2013, 1:44 PM

## 2013-05-12 NOTE — Progress Notes (Signed)
Pt walked the unit two times with no gait abnormalities.  The wife stated that he was even walking better today than yesterday.  Pt eager to be discharged home.

## 2013-05-12 NOTE — Progress Notes (Signed)
Subjective: Patient continued to have headache, primarily left occipital to left frontal. Limited by mouth intake of both liquids and solids. Some ambulation in the ICU yesterday, patient's wife describes mild unsteadiness. CT the brain without contrast yesterday shows mild blossoming of left temporal hemorrhagic cerebral contusion, and clearing of minimal traumatic subarachnoid hemorrhage.  Objective: Vital signs in last 24 hours: Filed Vitals:   05/12/13 0400 05/12/13 0600 05/12/13 0800 05/12/13 0813  BP: 114/62 118/69 129/74   Pulse: 47 52 64   Temp:    98.1 F (36.7 C)  TempSrc:    Oral  Resp: 15 23 19    Height:      Weight:      SpO2: 96% 99% 99%     Intake/Output from previous day: 03/24 0701 - 03/25 0700 In: 2860 [P.O.:460; I.V.:2400] Out: -  Intake/Output this shift: Total I/O In: 100 [I.V.:100] Out: -   Physical Exam:  Awake alert, oriented. Following commands. Moving all 4 extremities well. EOMI. Facial movements symmetrical. No drift of upper extremities.  CBC  Recent Labs  05/09/13 2338 05/11/13 0334  WBC 12.1* 6.8  HGB 15.4 13.3  HCT 43.5 37.4*  PLT 123* 110*   BMET  Recent Labs  05/09/13 2338 05/11/13 0334  NA 136* 139  K 4.3 4.0  CL 100 102  CO2 20 25  GLUCOSE 111* 97  BUN 13 13  CREATININE 1.01 1.02  CALCIUM 8.7 8.5    Studies/Results: Ct Head Wo Contrast  05/11/2013   CLINICAL DATA:  Closed head injury 2 days ago. Temporal bone fracture.  EXAM: CT HEAD WITHOUT CONTRAST  TECHNIQUE: Contiguous axial images were obtained from the base of the skull through the vertex without intravenous contrast.  COMPARISON:  CT scan dated 05/09/2013  FINDINGS: Again noted is the hemorrhagic contusion in the left posterior temporal lobe, with slight increased edema around the hemorrhage but the hemorrhage has not increased in size. There is no midline shift. There is a new tiny amount of blood in the occipital horn of the left lateral ventricle. The brain  otherwise appears normal.  Again noted is the fracture through the left temporal bone extending into the middle ear cavity and through the mastoid air cells. There is partial opacification of the left middle ear cavity, unchanged.  IMPRESSION: 1. New tiny amount of blood in the occipital horn of the left lateral ventricle. 2. New slight edema in the posterior temporal lobe adjacent to the stable intraparenchymal hemorrhage.   Electronically Signed   By: Rozetta Nunnery M.D.   On: 05/11/2013 08:42   Ct Temporal Bones W/o Cm  05/11/2013   CLINICAL DATA:  43 year old male who recently sustained a left temporal bone fracture and left temporal lobe contusion after a fall with subarachnoid hemorrhage. Dizziness. Initial encounter.  EXAM: CT TEMPORAL BONES WITHOUT CONTRAST  TECHNIQUE: Axial and coronal plane CT imaging of the petrous temporal bones was performed with thin-collimation image reconstruction. No intravenous contrast was administered. Multiplanar CT image reconstructions were also generated.  COMPARISON:  Head and cervical spine CTs 05/09/2013.  FINDINGS: Inferior left temporal lobe hemorrhagic contusion re- identified and not significantly changed in size, about 13 mm diameter (series 3, image 22). No associated mass effect. The entire brain is not visible today, but no extra-axial hemorrhage is identified. No intraventricular hemorrhage identified. No ventriculomegaly is evident.  Visualized orbit soft tissues are within normal limits. No scalp hematoma identified.  Visible paranasal sinuses remarkable for minimal to mild mucosal thickening,  mostly at the left frontal recess.  Right temporal bone:  Incidental pneumatized right sphenoid wing. Negative right EAC. Right tympanic cavity is clear. Ossicles within normal limits. Right mastoids are clear. Right IAC, cochlea, vestibule, semicircular canals, vestibular aqueduct, and course of the right seventh nerve are normal.  Left temporal bone: Minimally displaced  fracture through the posterior left mastoids (series 7, image 78). The fracture is comminuted, also extending caudally to the mastoid tip (image 48). From there the fracture probably propagates toward the EAC (image 52). The fracture does not definitely traverse the otic capsule; no fracture line is visible at the left petrous apex or elsewhere at the medial left skullbase.  Scattered fluid/opacification throughout the mastoid air cells and involving the tympanic cavity (mostly the mesotympanum and hypotympanum). Ossicles appear normally aligned. Fluid and/or soft tissue thickening in the left EAC. Left tympanic membrane obscured.  Left IAC, cochlea, vestibule and semicircular canals appear intact. Negative left vestibular aqueduct. Incidental pneumatized left sphenoid wing.  IMPRESSION: 1. Comminuted, minimally displaced left mastoid fracture not definitely traversing the otic capsule. Scattered opacification of the left mastoids, tympanic cavity, and EAC. Left side ossicles appear to remain intact and normally aligned. 2. Stable left inferior temporal lobe hemorrhagic contusion. No mass effect. 3. No extra-axial hemorrhage identified today; the entire brain is not imaged. No ventriculomegaly is evident. 4. Negative right temporal bone.   Electronically Signed   By: Lars Pinks M.D.   On: 05/11/2013 08:15    Assessment/Plan: Stable from a neurosurgical perspective. CT scan shows typical evolution of hemorrhagic cerebral contusion. Cause of mild thrombocytopenia uncertain, will repeat CBC with differential today. Spoke with patient, his wife, and his nurse; we'll want the patient to ambulate in the ICU today, and we'll recheck later.   Hosie Spangle, MD 05/12/2013, 9:59 AM

## 2013-05-28 ENCOUNTER — Other Ambulatory Visit: Payer: Self-pay | Admitting: Neurosurgery

## 2013-05-28 DIAGNOSIS — I609 Nontraumatic subarachnoid hemorrhage, unspecified: Secondary | ICD-10-CM

## 2013-05-31 ENCOUNTER — Ambulatory Visit
Admission: RE | Admit: 2013-05-31 | Discharge: 2013-05-31 | Disposition: A | Discharge status: Home / Self Care | Payer: BC Managed Care – PPO | Source: Ambulatory Visit | Attending: Neurosurgery | Admitting: Neurosurgery

## 2013-05-31 DIAGNOSIS — I609 Nontraumatic subarachnoid hemorrhage, unspecified: Secondary | ICD-10-CM

## 2013-07-16 ENCOUNTER — Ambulatory Visit
Admission: RE | Admit: 2013-07-16 | Discharge: 2013-07-16 | Disposition: A | Discharge status: Home / Self Care | Payer: BC Managed Care – PPO | Source: Ambulatory Visit | Attending: Neurosurgery | Admitting: Neurosurgery

## 2013-07-16 ENCOUNTER — Other Ambulatory Visit: Payer: BC Managed Care – PPO

## 2013-07-16 ENCOUNTER — Other Ambulatory Visit: Payer: Self-pay | Admitting: Neurosurgery

## 2013-07-16 DIAGNOSIS — S06339A Contusion and laceration of cerebrum, unspecified, with loss of consciousness of unspecified duration, initial encounter: Secondary | ICD-10-CM

## 2015-10-08 IMAGING — CT CT TEMPORAL BONES W/O CM
5 of 14 series · 17 of 47 positions shown, 18 images · non-contrast
Comparison: Head and cervical spine CTs 05/09/2013.

CLINICAL DATA: 42-year-old male who recently sustained a left
temporal bone fracture and left temporal lobe contusion after a fall
with subarachnoid hemorrhage. Dizziness. Initial encounter.

EXAM:
CT TEMPORAL BONES WITHOUT CONTRAST
TECHNIQUE: Axial and coronal plane CT imaging of the petrous temporal bones was
performed with thin-collimation image reconstruction. No intravenous
contrast was administered. Multiplanar CT image reconstructions were
also generated.

[Series 6: temporal bone right · axial · 0.21mm/px · z∈[-213,-169]mm · 4 of 124 slices shown, 5 images]
[im 25/124  brain]
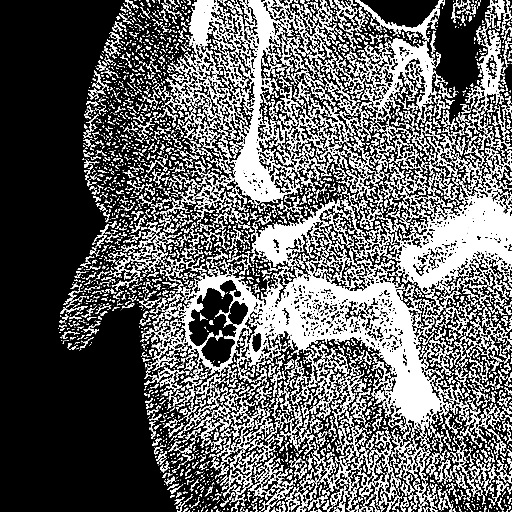
[im 25/124  bone]
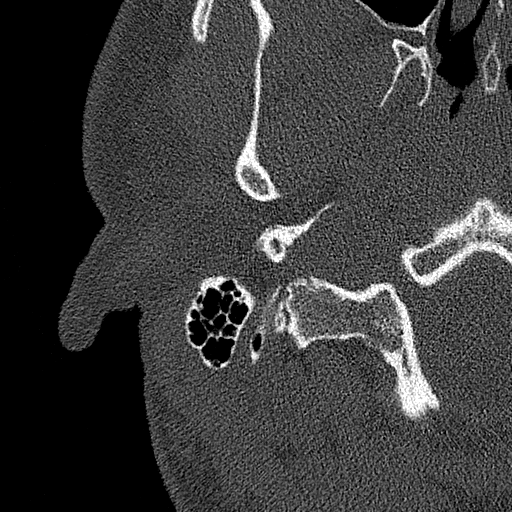
[im 50/124  bone]
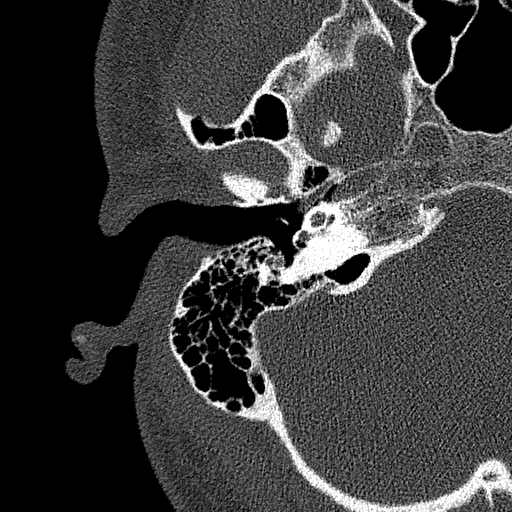
[im 74/124  bone]
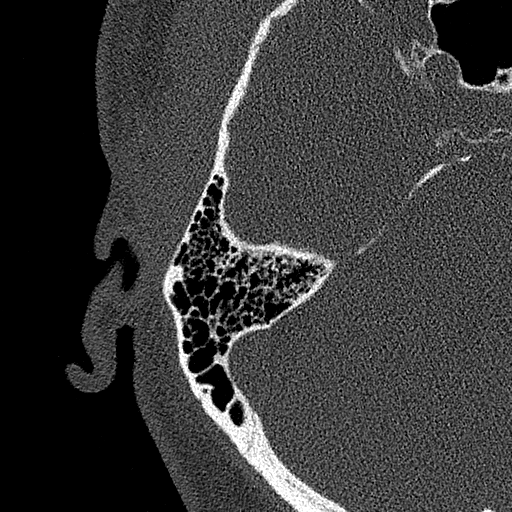
[im 99/124  bone]
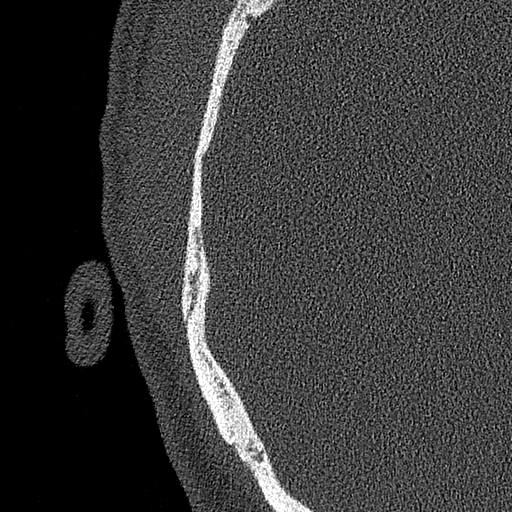

[Series 7: temporal bone left · axial · 0.21mm/px · z∈[-213,-169]mm · 4 of 124 slices shown (1 of 3)]
[im 25/124  bone]
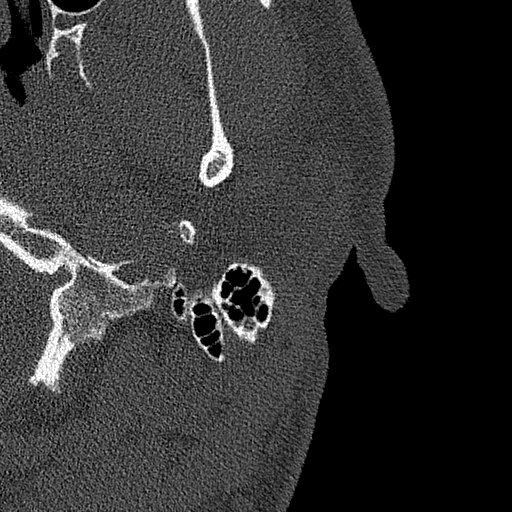
[im 50/124  bone]
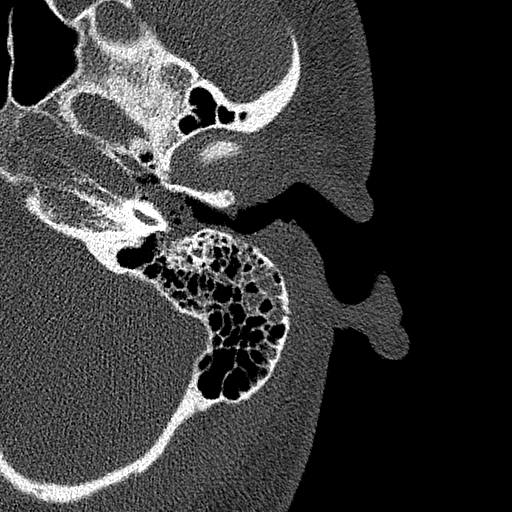
[im 74/124  bone]
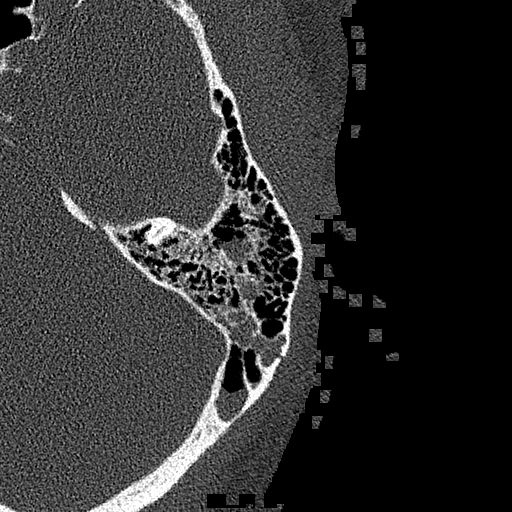
[im 99/124  bone]
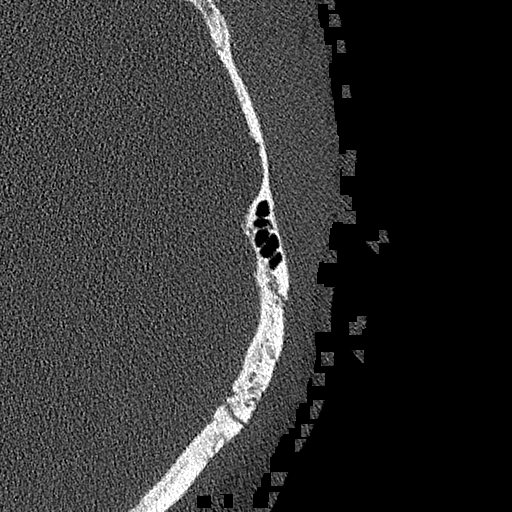

[Series 8: temporal bone left · axial · 0.21mm/px · z∈[-213,-169]mm · 4 of 124 slices shown (2 of 3)]
[im 25/124  bone]
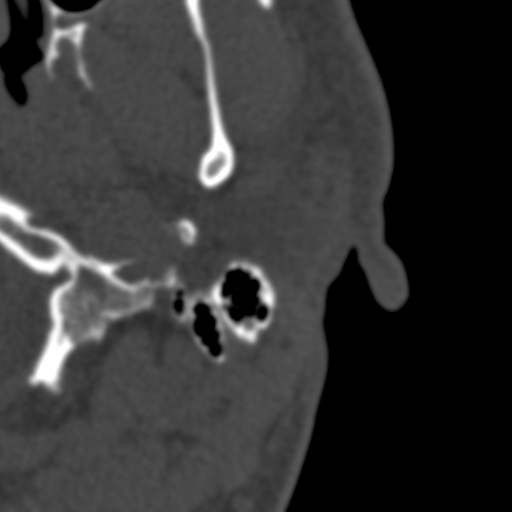
[im 50/124  bone]
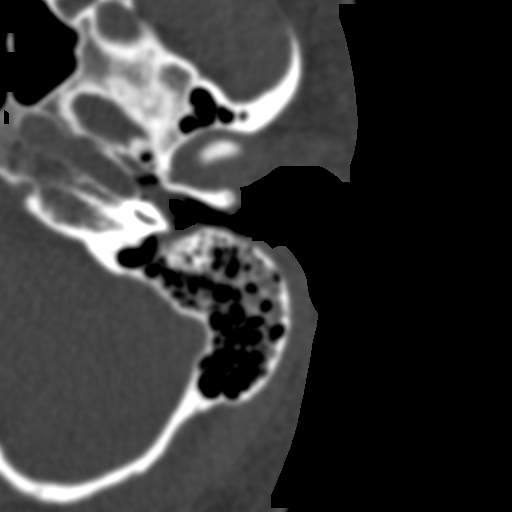
[im 74/124  bone]
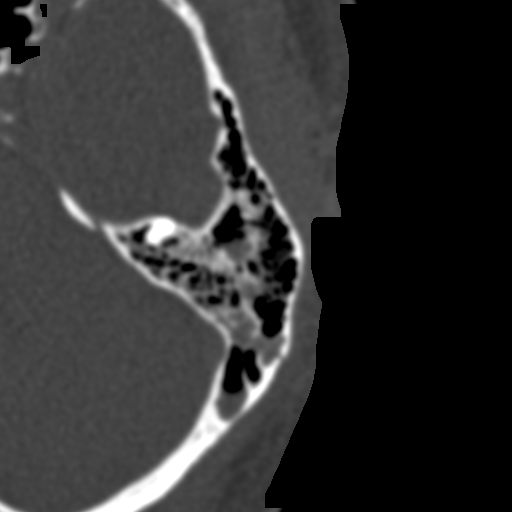
[im 99/124  bone]
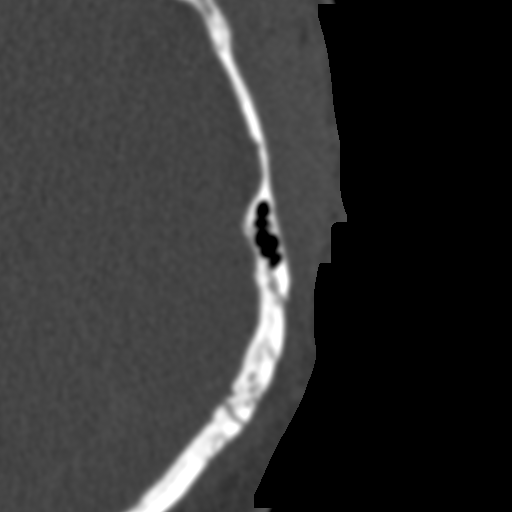

[Series 9: temporal bone left · axial · 0.21mm/px · z∈[-213,-169]mm · 4 of 124 slices shown (3 of 3)]
[im 25/124  bone]
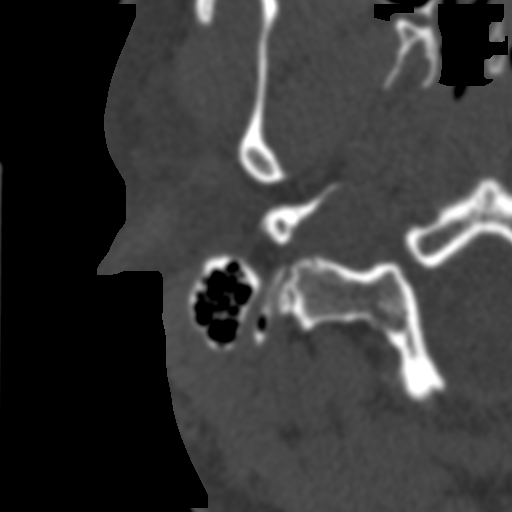
[im 50/124  bone]
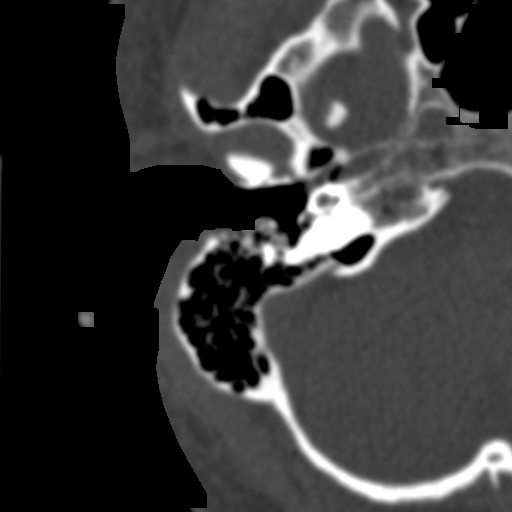
[im 74/124  bone]
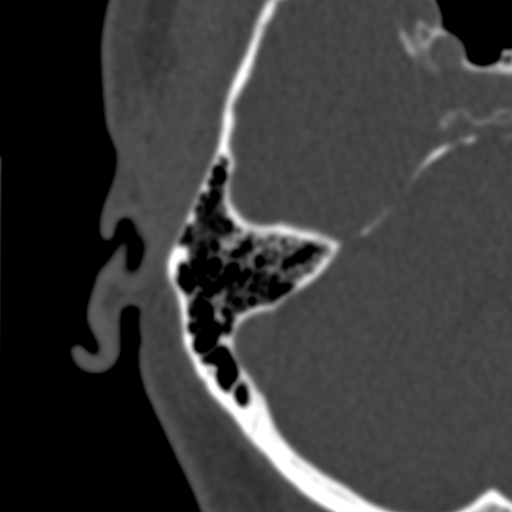
[im 99/124  bone]
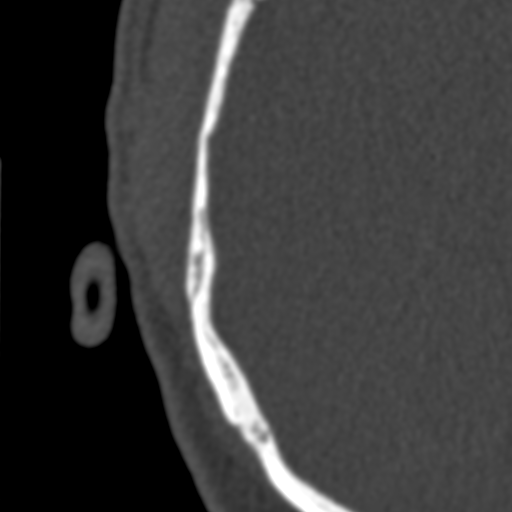

[Series 603: coronal left · coronal · 0.21mm/px · 1 of 58 slices shown]
[im 29/58  bone]
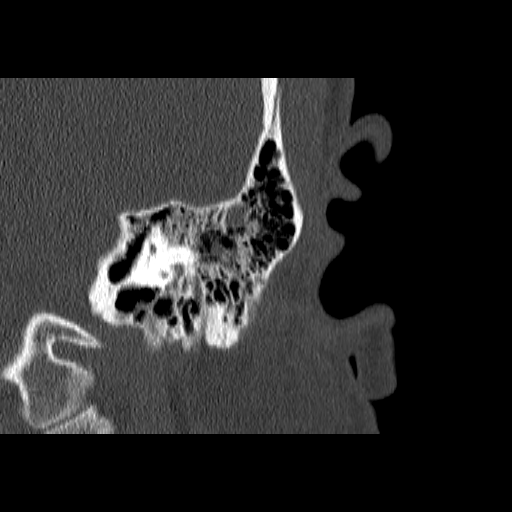

[17 of 47 positions shown; findings below may reference images not displayed]

FINDINGS: Inferior left temporal lobe hemorrhagic contusion re- identified and
not significantly changed in size, about 13 mm diameter (series 3,
image 22). No associated mass effect. The entire brain is not
visible today, but no extra-axial hemorrhage is identified. No
intraventricular hemorrhage identified. No ventriculomegaly is
evident.

Visualized orbit soft tissues are within normal limits. No scalp
hematoma identified.

Visible paranasal sinuses remarkable for minimal to mild mucosal
thickening, mostly at the left frontal recess.

Right temporal bone:

Incidental pneumatized right sphenoid wing. Negative right EAC.
Right tympanic cavity is clear. Ossicles within normal limits. Right
mastoids are clear. Right IAC, cochlea, vestibule, semicircular
canals, vestibular aqueduct, and course of the right seventh nerve
are normal.

Left temporal bone: Minimally displaced fracture through the
posterior left mastoids (series 7, image 78). The fracture is
comminuted, also extending caudally to the mastoid tip (image 48).
From there the fracture probably propagates toward the EAC (image
52).
The fracture does not definitely traverse the otic capsule; no
fracture line is visible at the left petrous apex or elsewhere at
the medial left skullbase.

Scattered fluid/opacification throughout the mastoid air cells and
involving the tympanic cavity (mostly the mesotympanum and
hypotympanum). Ossicles appear normally aligned. Fluid and/or soft
tissue thickening in the left EAC. Left tympanic membrane obscured.

Left IAC, cochlea, vestibule and semicircular canals appear intact.
Negative left vestibular aqueduct. Incidental pneumatized left
sphenoid wing.
IMPRESSION: 1. Comminuted, minimally displaced left mastoid fracture not
definitely traversing the otic capsule. Scattered opacification of
the left mastoids, tympanic cavity, and EAC. Left side ossicles
appear to remain intact and normally aligned.
2. Stable left inferior temporal lobe hemorrhagic contusion. No mass
effect.
3. No extra-axial hemorrhage identified today; the entire brain is
not imaged. No ventriculomegaly is evident.
4. Negative right temporal bone.

## 2016-11-07 ENCOUNTER — Encounter: Payer: Self-pay | Admitting: Family Medicine

## 2018-06-23 ENCOUNTER — Encounter (HOSPITAL_COMMUNITY): Payer: Self-pay

## 2018-06-23 ENCOUNTER — Other Ambulatory Visit: Payer: Self-pay

## 2018-06-23 ENCOUNTER — Emergency Department (HOSPITAL_COMMUNITY)
Admission: EM | Admit: 2018-06-23 | Discharge: 2018-06-23 | Disposition: A | Discharge status: Home / Self Care | Payer: BLUE CROSS/BLUE SHIELD | Attending: Emergency Medicine | Admitting: Emergency Medicine

## 2018-06-23 DIAGNOSIS — H5789 Other specified disorders of eye and adnexa: Secondary | ICD-10-CM

## 2018-06-23 DIAGNOSIS — L03213 Periorbital cellulitis: Secondary | ICD-10-CM | POA: Diagnosis not present

## 2018-06-23 MED ORDER — PREDNISONE 20 MG PO TABS: 40.0000 mg | ORAL_TABLET | Freq: Every day | ORAL | 0 refills | Status: AC

## 2018-06-23 MED ORDER — CEPHALEXIN 500 MG PO CAPS: 500.0000 mg | ORAL_CAPSULE | Freq: Four times a day (QID) | ORAL | 0 refills | Status: AC

## 2018-06-23 MED ORDER — TETRACAINE HCL 0.5 % OP SOLN
2.0000 [drp] | Freq: Once | OPHTHALMIC | Status: AC
  Administered 2018-06-23: 2 [drp] via OPHTHALMIC
  Filled 2018-06-23: qty 4

## 2018-06-23 MED ORDER — FLUORESCEIN SODIUM 1 MG OP STRP
1.0000 | ORAL_STRIP | Freq: Once | OPHTHALMIC | Status: AC
  Administered 2018-06-23: 1 via OPHTHALMIC
  Filled 2018-06-23: qty 1

## 2018-06-23 NOTE — ED Notes (Signed)
Pt discharged from ED; instructions provided and scripts given; Pt encouraged to return to ED if symptoms worsen and to f/u with PCP; Pt verbalized understanding of all instructions 

## 2018-06-23 NOTE — Discharge Instructions (Addendum)
Take Prednisone for the next 5 days Take Keflex 4 times daily for 5 days Please return if worsening

## 2018-06-23 NOTE — ED Provider Notes (Signed)
[facility] EMERGENCY DEPARTMENT Provider Note   CSN: [id number] Arrival date & time: 06/23/18  0608    History   Chief Complaint Chief Complaint  Patient presents with  . Eye Problem    HPI George Hahn is a 48 y.o. male who presents with R eye irritation. No significant PMH.  Patient states that he has had redness and irritation around his right eye for approximately 2 months.  He states he was training for a marathon and was running in the woods and a stick flew up and hit him in the eye and ever since then he has had redness around the eye.  He is went to urgent care twice and states that due to the [place] pandemic no is really gotten close enough to actually take a look at his eye.  They gave him triamcinolone cream which he has been using without relief.  They told him to go to an eye doctor which he did who told him to use Pataday and this irritated his eye event more.  He is worried that he may have an infection in the area.  He denies history of any skin disorder such as eczema or psoriasis.  No fevers.  No eye pain, vision change.  He has some itchiness around the skin of his eyes sometimes.  The swelling became worse overnight after he applied ice to the area therefore he decided to come to the ED this morning.     HPI  History reviewed. No pertinent past medical history.  Patient Active Problem List   Diagnosis Date Noted  . Head injury, closed, with brief LOC ([place]) 05/09/2013  . HAND PAIN 06/27/2009  . 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 ABDOMINAL PAIN, UNSPECIFIED SITE 04/04/2009    History reviewed. No pertinent surgical history.      Home Medications    Prior to Admission medications   Medication Sig Start Date End Date Taking? Authorizing Provider  oxyCODONE-acetaminophen (PERCOCET/ROXICET) 5-325 MG per tablet Take 1-2 tablets by mouth every 4 (four) hours as needed for moderate pain. 05/12/13   Jovita Gamma, MD    Family History No family  history on file.  Social History Social History   Tobacco Use  . Smoking status: Never Smoker  Substance Use Topics  . Alcohol use: Yes  . Drug use: No     Allergies   Patient has no known allergies.   Review of Systems Review of Systems  Eyes: Negative for photophobia, pain, discharge, redness, itching and visual disturbance.  Skin: Positive for color change.     Physical Exam Updated Vital Signs BP (!) 127/95   Pulse 60   Temp (!) 97.3 F (36.3 C) (Oral)   Resp 16   Ht 5' 10.25" (1.784 m)   Wt 95.3 kg   SpO2 95%   BMI 29.92 kg/m   Physical Exam Vitals signs and nursing note reviewed.  Constitutional:      General: He is not in acute distress.    Appearance: He is well-developed.  HENT:     Head: Normocephalic and atraumatic.  Eyes:     General: Lids are normal. No scleral icterus.       Right eye: No discharge.        Left eye: No discharge.     Extraocular Movements: Extraocular movements intact.     Conjunctiva/sclera: Conjunctivae normal.     Right eye: Right conjunctiva is not injected.     Left eye: Left conjunctiva is  not injected.     Pupils: Pupils are equal, round, and reactive to light.  Neck:     Musculoskeletal: Normal range of motion.  Cardiovascular:     Rate and Rhythm: Normal rate and regular rhythm.  Pulmonary:     Effort: Pulmonary effort is normal. No respiratory distress.     Breath sounds: Normal breath sounds.  Abdominal:     General: There is no distension.  Skin:    General: Skin is warm and dry.  Neurological:     Mental Status: He is alert and oriented to person, place, and time.  Psychiatric:        Behavior: Behavior normal.      ED Treatments / Results  Labs (all labs ordered are listed, but only abnormal results are displayed) Labs Reviewed - No data to display  EKG None  Radiology No results found.  Procedures Procedures (including critical care time)  Medications Ordered in ED Medications   fluorescein ophthalmic strip 1 strip (1 strip Both Eyes Given by Other 06/23/18 0730)  tetracaine (PONTOCAINE) 0.5 % ophthalmic solution 2 drop (2 drops Both Eyes Given by Other 06/23/18 0730)     Initial Impression / Assessment and Plan / ED Course  I have reviewed the triage vital signs and the nursing notes.  Pertinent labs & imaging results that were available during my care of the patient were reviewed by me and considered in my medical decision making (see chart for details).  48 year old male presents with R eye irritation for the past 2 months. His vitals are normal. Visual acuity is intact. The problem does not seem to actually be affecting his eye, it appears to be chronic inflammation of the R periorbital area. I cannot identify any obvious irritants. He is worried about infection which I doubt but there is possibility of a secondary infection since he has had it for some time now. He is requesting a wound culture of the area which was done but I also advised him this likely won't be very helpful. He was given a steroid burst and rx for Keflex and advised return if worsening.  Final Clinical Impressions(s) / ED Diagnoses   Final diagnoses:  Eye irritation  Periorbital cellulitis of right eye    ED Discharge Orders    None       Recardo Evangelist, PA-C 06/23/18 0801    Tegeler, Gwenyth Allegra, MD 06/23/18 [phone]

## 2018-06-23 NOTE — ED Triage Notes (Signed)
Pt arrives via POV with c/o R eye irritation; pt has been seen by another hospital and by eye MD; Pt states he wants the area swab to find out if he has an infection; Pt has been given eye drops; patient has red rash around entire right eye; Pt denies pain but states the area is itchy-Monique,RN

## 2018-06-25 LAB — AEROBIC CULTURE  (SUPERFICIAL SPECIMEN)
Gram Stain: NONE SEEN
Special Requests: NORMAL

## 2018-06-25 LAB — AEROBIC CULTURE W GRAM STAIN (SUPERFICIAL SPECIMEN)

## 2018-06-26 ENCOUNTER — Telehealth: Payer: Self-pay | Admitting: *Deleted

## 2018-06-26 NOTE — Progress Notes (Signed)
ED Antimicrobial Stewardship Positive Culture Follow Up   George Hahn is an 48 y.o. male who presented to [facility] on 06/23/2018 with a chief complaint of right eye irritation/redness x 2 months. Pt afebrile on admission with VSS. Superficial would culture from eye resulted with rare staph epidermidis, resistant to keflex. Most likely contaminant.  Plan:  Follow-up with patient via wellness check If patient is not symptomatic do not treat If patient symptomatic, treat with Bactrim 800mg /160mg  PO BID x 5 days  Chief Complaint  Patient presents with  . Eye Problem    Recent Results (from the past 720 hour(s))  Wound or Superficial Culture     Status: None   Collection Time: 06/23/18  7:27 AM  Result Value Ref Range Status   Specimen Description EYE  Final   Special Requests Normal  Final   Gram Stain   Final    NO WBC SEEN NO ORGANISMS SEEN Performed at [facility], [address] [place], [place] [address]    Culture RARE STAPHYLOCOCCUS EPIDERMIDIS  Final   Report Status 06/25/2018 FINAL  Final   Organism ID, Bacteria STAPHYLOCOCCUS EPIDERMIDIS  Final      Susceptibility   Staphylococcus epidermidis - MIC*    CIPROFLOXACIN <=0.5 SENSITIVE Sensitive     ERYTHROMYCIN >=8 RESISTANT Resistant     GENTAMICIN 8 INTERMEDIATE Intermediate     OXACILLIN >=4 RESISTANT Resistant     TETRACYCLINE >=16 RESISTANT Resistant     VANCOMYCIN 2 SENSITIVE Sensitive     TRIMETH/SULFA <=10 SENSITIVE Sensitive     CLINDAMYCIN RESISTANT Resistant     RIFAMPIN <=0.5 SENSITIVE Sensitive     Inducible Clindamycin POSITIVE Resistant     * RARE STAPHYLOCOCCUS EPIDERMIDIS    [x]  Treated with cephalexin, organism resistant to prescribed antimicrobial  ED Provider: Delia Heady, PA-C  Thank you for allowing pharmacy to be a part of this patient's care.  Leron Croak, PharmD PGY1 Pharmacy Resident Phone: [phone]  Please check AMION for all [facility] phone  numbers 06/26/2018, 8:50 AM

## 2018-06-26 NOTE — Telephone Encounter (Signed)
Post ED Visit - Positive Culture Follow-up: Unsuccessful Patient Follow-up  Culture assessed and recommendations reviewed by:  []  Elenor Quinones, Pharm.D. []  Heide Guile, Pharm.D., BCPS AQ-ID []  Parks Neptune, Pharm.D., BCPS []  Alycia Rossetti, Pharm.D., BCPS []  [place], Pharm.D., BCPS, AAHIVP []  Legrand Como, Pharm.D., BCPS, AAHIVP []  Wynell Balloon, PharmD []  Vincenza Hews, PharmD, BCPS  Positive wound culture, reviewed by Sherrlyn Hock, PA  []  Patient discharged without antimicrobial prescription and treatment is now indicated []  Organism is resistant to prescribed ED discharge antimicrobial []  Patient with positive blood cultures  Plan:  Symptom check, if still symptomatic treat with Bactrim 800-160mg  PO BID x d5 days Unable to contact patient after 3 attempts, letter will be sent to address on file  Ardeen Fillers 06/26/2018, 12:07 PM

## 2018-07-03 ENCOUNTER — Telehealth: Payer: Self-pay

## 2018-07-03 NOTE — Telephone Encounter (Signed)
Post ED Visit - Positive Culture Follow-up: Successful Patient Follow-Up  Culture assessed and recommendations reviewed by:  []  Elenor Quinones, Pharm.D. []  Heide Guile, Pharm.D., BCPS AQ-ID []  Parks Neptune, Pharm.D., BCPS []  Alycia Rossetti, Pharm.D., BCPS []  [place], Pharm.D., BCPS, AAHIVP []  Legrand Como, Pharm.D., BCPS, AAHIVP []  Salome Arnt, PharmD, BCPS []  Johnnette Gourd, PharmD, BCPS []  Hughes Better, PharmD, BCPS []  Leeroy Cha, PharmD R Philis Kendall D Positive aerobic culture  []  Patient discharged without antimicrobial prescription and treatment is now indicated []  Organism is resistant to prescribed ED discharge antimicrobial []  Patient with positive blood cultures Symptom check  Still withproblems Changes discussed with ED provider: [facility] New antibiotic prescription Bactrim 800/160mg  BID x 5 days Called to [facility] [phone]  Contacted patient, date 07/03/2018, time [place], Carolynn Comment 07/03/2018, 2:54 PM

## 2019-06-14 ENCOUNTER — Ambulatory Visit: Payer: BC Managed Care – PPO | Attending: Internal Medicine

## 2019-06-14 DIAGNOSIS — Z23 Encounter for immunization: Secondary | ICD-10-CM

## 2019-06-14 NOTE — Progress Notes (Signed)
   Covid-19 Vaccination Clinic  Name:  George Hahn    MRN: [id number] DOB: 30-Nov-1970  06/14/2019  [name] was observed post Covid-19 immunization for 15 minutes without incident. He was provided with Vaccine Information Sheet and instruction to access the V-Safe system.   [name] was instructed to call 911 with any severe reactions post vaccine: Marland Kitchen Difficulty breathing  . Swelling of face and throat  . A fast heartbeat  . A bad rash all over body  . Dizziness and weakness   Immunizations Administered    Name Date Dose VIS Date Route   Pfizer COVID-19 Vaccine 06/14/2019  5:09 PM 0.3 mL 04/14/2018 Intramuscular   Manufacturer: [place]   Lot: [id number]   [place]: [id number]

## 2020-07-01 ENCOUNTER — Other Ambulatory Visit: Payer: Self-pay

## 2020-07-01 ENCOUNTER — Emergency Department (HOSPITAL_COMMUNITY): Payer: BC Managed Care – PPO

## 2020-07-01 ENCOUNTER — Encounter (HOSPITAL_COMMUNITY): Payer: Self-pay

## 2020-07-01 ENCOUNTER — Emergency Department (HOSPITAL_COMMUNITY)
Admission: EM | Admit: 2020-07-01 | Discharge: 2020-07-01 | Disposition: A | Discharge status: Home / Self Care | Payer: BC Managed Care – PPO | Attending: Emergency Medicine | Admitting: Emergency Medicine

## 2020-07-01 DIAGNOSIS — S42192A Fracture of other part of scapula, left shoulder, initial encounter for closed fracture: Secondary | ICD-10-CM | POA: Diagnosis not present

## 2020-07-01 DIAGNOSIS — Y92410 Unspecified street and highway as the place of occurrence of the external cause: Secondary | ICD-10-CM | POA: Insufficient documentation

## 2020-07-01 DIAGNOSIS — S4992XA Unspecified injury of left shoulder and upper arm, initial encounter: Secondary | ICD-10-CM | POA: Diagnosis present

## 2020-07-01 DIAGNOSIS — S42102A Fracture of unspecified part of scapula, left shoulder, initial encounter for closed fracture: Secondary | ICD-10-CM

## 2020-07-01 MED ORDER — IBUPROFEN 600 MG PO TABS: 600.0000 mg | ORAL_TABLET | Freq: Four times a day (QID) | ORAL | 0 refills | Status: AC | PRN

## 2020-07-01 MED ORDER — HYDROCODONE-ACETAMINOPHEN 5-325 MG PO TABS: 1.0000 | ORAL_TABLET | ORAL | 0 refills | Status: AC | PRN
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
## 2020-07-01 NOTE — Progress Notes (Signed)
Orthopedic Tech Progress Note Patient Details:  George Hahn [birth year] [id number]  Ortho Devices Type of Ortho Device: Arm sling Ortho Device/Splint Location: lue Ortho Device/Splint Interventions: Ordered,Application,Adjustment   Post Interventions Patient Tolerated: Well Instructions Provided: Care of device,Adjustment of device   Karolee Stamps 07/01/2020, 5:27 AM
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
## 2020-07-01 NOTE — Discharge Instructions (Signed)
Follow up with Dr. Marcelino Scot, orthopedics, or an orthopedist of your choice, to insure uncomplicated healing of scapular fracture.   Take medications as prescribed. Return to the ED as discussed if you develop any chest pain, SOB or pain with breathing.
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
